# Patient Record
Sex: Male | Born: 1960 | Race: White | Hispanic: No | Smoking: Never smoker
Health system: Southern US, Community
[De-identification: ages and names within clinical notes are randomized; demographics above are authoritative.]

## PROBLEM LIST (undated history)

## (undated) DIAGNOSIS — N419 Inflammatory disease of prostate, unspecified: Secondary | ICD-10-CM

## (undated) DIAGNOSIS — R9431 Abnormal electrocardiogram [ECG] [EKG]: Secondary | ICD-10-CM

## (undated) DIAGNOSIS — L719 Rosacea, unspecified: Secondary | ICD-10-CM

## (undated) DIAGNOSIS — R9439 Abnormal result of other cardiovascular function study: Secondary | ICD-10-CM

## (undated) DIAGNOSIS — R569 Unspecified convulsions: Secondary | ICD-10-CM

## (undated) HISTORY — DX: Unspecified convulsions: R56.9

## (undated) HISTORY — PX: NASAL SEPTUM SURGERY: SHX37

## (undated) HISTORY — DX: Inflammatory disease of prostate, unspecified: N41.9

## (undated) HISTORY — DX: Rosacea, unspecified: L71.9

## (undated) HISTORY — PX: COLONOSCOPY: SHX174

## (undated) HISTORY — DX: Abnormal electrocardiogram (ECG) (EKG): R94.31

## (undated) HISTORY — PX: WISDOM TOOTH EXTRACTION: SHX21

## (undated) HISTORY — DX: Abnormal result of other cardiovascular function study: R94.39

---

## 2011-05-13 DIAGNOSIS — N4281 Prostatodynia syndrome: Secondary | ICD-10-CM | POA: Insufficient documentation

## 2013-08-22 ENCOUNTER — Ambulatory Visit (INDEPENDENT_AMBULATORY_CARE_PROVIDER_SITE_OTHER): Payer: Managed Care, Other (non HMO) | Admitting: General Surgery

## 2013-08-22 ENCOUNTER — Encounter (INDEPENDENT_AMBULATORY_CARE_PROVIDER_SITE_OTHER): Payer: Self-pay | Admitting: General Surgery

## 2013-08-22 VITALS — BP 122/86 | HR 78 | Temp 97.8°F | Resp 14 | Ht 75.0 in | Wt 210.8 lb

## 2013-08-22 DIAGNOSIS — R1031 Right lower quadrant pain: Secondary | ICD-10-CM

## 2013-08-22 NOTE — Progress Notes (Signed)
Patient ID: Darrell Contreras, male   DOB: September 30, 1960, 53 y.o.   MRN: [id number]  Chief Complaint  Patient presents with  . New Evaluation    eval RIH    HPI [name] is a 53 y.o. male.  Right inguinal pain  HPI Has had years of right inguinal pain with exertion and lifting.  No bulge or swelling.    No incarceration. Also has post-coital pain and pain with ejaculation. No past medical history on file.  No past surgical history on file.  Family History  Problem Relation Age of Onset  . Cancer Mother 74    breast cancer  . Cancer Father 31    skin cancer    Social History History  Substance Use Topics  . Smoking status: Never Smoker   . Smokeless tobacco: Never Used  . Alcohol Use: 1.2 oz/week    1 Glasses of wine, 1 Cans of beer per week     Comment: nightly 1 drink    No Known Allergies  No current outpatient prescriptions on file.   No current facility-administered medications for this visit.    Review of Systems Review of Systems  Gastrointestinal:       Right inguinal pain.  All other systems reviewed and are negative.   Blood pressure 122/86, pulse 78, temperature 97.8 F (36.6 C), temperature source Temporal, resp. rate 14, height 6\' 3"  (1.905 m), weight 210 lb 12.8 oz (95.618 kg).  Physical Exam Physical Exam  Constitutional: He is oriented to person, place, and time. He appears well-developed and well-nourished.  HENT:  Head: Normocephalic and atraumatic.  Right Ear: External ear normal.  Eyes: Conjunctivae and EOM are normal. Pupils are equal, round, and reactive to light.  Neck: Normal range of motion. Neck supple.  Cardiovascular: Normal rate, regular rhythm, normal heart sounds and intact distal pulses.   Pulmonary/Chest: Effort normal and breath sounds normal.  Abdominal: There is tenderness (Right lower quadrant tenderness).  Genitourinary: Penis normal.  Musculoskeletal: Normal range of motion.  Neurological: He is alert and oriented to person,  place, and time.  Skin: Skin is warm and dry.  Psychiatric: He has a normal mood and affect. His behavior is normal. Judgment and thought content normal.    Data Reviewed No data to review  Assessment    Right inguinal pain, no hernia     Plan    Refer to primary care as needed.  Als refer to urology        Gwenyth Ober 08/22/2013, 9:13 AM

## 2014-07-24 ENCOUNTER — Encounter: Payer: Self-pay | Admitting: *Deleted

## 2014-07-24 ENCOUNTER — Emergency Department (INDEPENDENT_AMBULATORY_CARE_PROVIDER_SITE_OTHER)
Admission: EM | Admit: 2014-07-24 | Discharge: 2014-07-24 | Disposition: A | Discharge status: Home / Self Care | Payer: Managed Care, Other (non HMO) | Source: Home / Self Care | Attending: Emergency Medicine | Admitting: Emergency Medicine

## 2014-07-24 DIAGNOSIS — S0101XD Laceration without foreign body of scalp, subsequent encounter: Secondary | ICD-10-CM

## 2014-07-24 DIAGNOSIS — A6000 Herpesviral infection of urogenital system, unspecified: Secondary | ICD-10-CM | POA: Diagnosis not present

## 2014-07-24 MED ORDER — VALACYCLOVIR HCL 1 G PO TABS: 1000.0000 mg | ORAL_TABLET | Freq: Every day | ORAL | Status: AC
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
## 2015-01-16 ENCOUNTER — Ambulatory Visit (INDEPENDENT_AMBULATORY_CARE_PROVIDER_SITE_OTHER): Payer: Managed Care, Other (non HMO) | Admitting: Family Medicine

## 2015-01-16 ENCOUNTER — Encounter: Payer: Self-pay | Admitting: Family Medicine

## 2015-01-16 VITALS — BP 130/79 | HR 63 | Ht 76.0 in | Wt 209.0 lb

## 2015-01-16 DIAGNOSIS — N529 Male erectile dysfunction, unspecified: Secondary | ICD-10-CM

## 2015-01-16 DIAGNOSIS — Z87438 Personal history of other diseases of male genital organs: Secondary | ICD-10-CM | POA: Diagnosis not present

## 2015-01-16 DIAGNOSIS — N4281 Prostatodynia syndrome: Secondary | ICD-10-CM

## 2015-01-16 DIAGNOSIS — B002 Herpesviral gingivostomatitis and pharyngotonsillitis: Secondary | ICD-10-CM | POA: Insufficient documentation

## 2015-01-16 DIAGNOSIS — N400 Enlarged prostate without lower urinary tract symptoms: Secondary | ICD-10-CM | POA: Insufficient documentation

## 2015-01-16 NOTE — Assessment & Plan Note (Signed)
Check testosterone, LH, FSH

## 2015-01-16 NOTE — Progress Notes (Signed)
Darrell Contreras is a 54 y.o. male who presents to [place]: [facility]  today for establish care.  He has a history of frequent episodes of prostatitis with possible BPH. In the past he was prescribed finasteride. He notes he has not been taking this for last 7 months and attempt to conceive a child. He is worried about his risk of prostate cancer. His father was diagnosed with prostate cancer but died of a different diagnosis. He denies any significant urinary symptoms. He notes mild urinary frequency and mild difficulty to postpone urination and mild weakness urinary stream. He does have some erectile dysfunction symptoms difficulty maintaining erections. He typically takes Cialis which works well.  Additionally patient notes a decreased sperm count. He's been attempting to conceive a child for the last several months and has had difficulty. He is successfully conceived 4 children prior. He notes that he's been using a laptop on his lab and thinks this may be the cause.   History reviewed. No pertinent past medical history. History reviewed. No pertinent past surgical history. Social History  Substance Use Topics  . Smoking status: Never Smoker   . Smokeless tobacco: Never Used  . Alcohol Use: No   family history includes Aneurysm in his father; Cancer (age of onset: 12) in his mother; Cancer (age of onset: 23) in his father.  ROS as above Medications: Current Outpatient Prescriptions  Medication Sig Dispense Refill  . Azelaic Acid 15 % cream Apply topically 2 (two) times daily. After skin is thoroughly washed and patted dry, gently but thoroughly massage a thin film of azelaic acid cream into the affected area twice daily, in the morning and evening.    . finasteride (PROSCAR) 5 MG tablet Take 5 mg by mouth.     No current facility-administered medications for this visit.   No Known Allergies   Exam:  BP 130/79 mmHg  Pulse 63  Ht 6\' 4"  (1.93 m)  Wt 209 lb  (94.802 kg)  BMI 25.45 kg/m2 Gen: Well NAD  HEENT: EOMI,  MMM Lungs: Normal work of breathing. CTABL Heart: RRR no MRG Abd: NABS, Soft. Nondistended, Nontender Exts: Brisk capillary refill, warm and well perfused.  Genitals: No inguinal lymphadenopathy. Testicles are descended bilaterally and nontender with no masses. Penis circumcised and normal appearing without lesions.  No results found for this or any previous visit (from the past 24 hour(s)). No results found.   Please see individual assessment and plan sections.

## 2015-01-16 NOTE — Patient Instructions (Signed)
Thank you for coming in today. Get morning testing soon.  Return for a wellness test in [place].  Try to get records sent to me.

## 2015-01-16 NOTE — Assessment & Plan Note (Signed)
Check PSA. Discussed risks and benefits of prostate cancer screening.

## 2015-01-18 LAB — TESTOSTERONE, FREE, TOTAL, SHBG
SEX HORMONE BINDING: 50 nmol/L (ref 10–50)
TESTOSTERONE-% FREE: 1.5 % — AB (ref 1.6–2.9)
Testosterone, Free: 54.3 pg/mL (ref 47.0–244.0)
Testosterone: 356 ng/dL (ref 300–890)

## 2015-01-18 LAB — FSH/LH
FSH: 8.2 m[IU]/mL (ref 1.4–18.1)
LH: 6.7 m[IU]/mL (ref 1.5–9.3)

## 2015-01-18 LAB — PSA: PSA: 0.25 ng/mL (ref <=4.00)

## 2015-01-18 NOTE — Progress Notes (Signed)
Quick Note:  Labs are normal. Repeat PSA in 1 year ______

## 2015-12-05 ENCOUNTER — Encounter: Payer: Self-pay | Admitting: Family Medicine

## 2015-12-26 ENCOUNTER — Other Ambulatory Visit: Payer: Self-pay | Admitting: Family Medicine

## 2015-12-26 ENCOUNTER — Ambulatory Visit (INDEPENDENT_AMBULATORY_CARE_PROVIDER_SITE_OTHER): Payer: Managed Care, Other (non HMO) | Admitting: Family Medicine

## 2015-12-26 ENCOUNTER — Encounter: Payer: Self-pay | Admitting: Family Medicine

## 2015-12-26 VITALS — BP 136/83 | HR 60 | Ht 76.0 in | Wt 217.0 lb

## 2015-12-26 DIAGNOSIS — IMO0001 Reserved for inherently not codable concepts without codable children: Secondary | ICD-10-CM

## 2015-12-26 DIAGNOSIS — Z0000 Encounter for general adult medical examination without abnormal findings: Secondary | ICD-10-CM | POA: Diagnosis not present

## 2015-12-26 DIAGNOSIS — Z87438 Personal history of other diseases of male genital organs: Secondary | ICD-10-CM

## 2015-12-26 LAB — CBC
HCT: 45.6 % (ref 38.5–50.0)
Hemoglobin: 15.5 g/dL (ref 13.2–17.1)
MCH: 30 pg (ref 27.0–33.0)
MCHC: 34 g/dL (ref 32.0–36.0)
MCV: 88.4 fL (ref 80.0–100.0)
MPV: 10.5 fL (ref 7.5–12.5)
PLATELETS: 253 10*3/uL (ref 140–400)
RBC: 5.16 MIL/uL (ref 4.20–5.80)
RDW: 13.7 % (ref 11.0–15.0)
WBC: 5.6 10*3/uL (ref 3.8–10.8)

## 2015-12-26 LAB — TSH: TSH: 1.5 mIU/L (ref 0.40–4.50)

## 2015-12-26 LAB — COMPREHENSIVE METABOLIC PANEL
ALT: 21 U/L (ref 9–46)
AST: 23 U/L (ref 10–35)
Albumin: 4.5 g/dL (ref 3.6–5.1)
Alkaline Phosphatase: 54 U/L (ref 40–115)
BUN: 11 mg/dL (ref 7–25)
CHLORIDE: 105 mmol/L (ref 98–110)
CO2: 25 mmol/L (ref 20–31)
Calcium: 10 mg/dL (ref 8.6–10.3)
Creat: 1.03 mg/dL (ref 0.70–1.33)
GLUCOSE: 100 mg/dL — AB (ref 65–99)
Potassium: 4.6 mmol/L (ref 3.5–5.3)
Sodium: 137 mmol/L (ref 135–146)
Total Bilirubin: 0.8 mg/dL (ref 0.2–1.2)
Total Protein: 7.1 g/dL (ref 6.1–8.1)

## 2015-12-26 LAB — LIPID PANEL
Cholesterol: 180 mg/dL (ref 125–200)
HDL: 46 mg/dL (ref >=40)
LDL Cholesterol: 111 mg/dL (ref <130)
TRIGLYCERIDES: 116 mg/dL (ref <150)
Total CHOL/HDL Ratio: 3.9 Ratio (ref <=5.0)
VLDL: 23 mg/dL (ref <30)

## 2015-12-26 LAB — HEMOGLOBIN A1C
Hgb A1c MFr Bld: 5.7 % — ABNORMAL HIGH (ref <5.7)
Mean Plasma Glucose: 117 mg/dL

## 2015-12-26 LAB — PSA: PSA: 0.2 ng/mL (ref <=4.0)

## 2015-12-26 NOTE — Progress Notes (Signed)
       Darrell Contreras is a 55 y.o. male who presents to [place] today for well visit. Patient is here for today with essentially no symptoms. He feels very well and only takes the below medication for rosacea. He exercises regularly. No fevers or chills nausea vomiting or diarrhea. He feels safe at home and satisfied with his job.   Past Medical History:  Diagnosis Date  . Prostatitis    History reviewed. No pertinent surgical history. Social History  Substance Use Topics  . Smoking status: Never Smoker  . Smokeless tobacco: Never Used  . Alcohol use No   family history includes Aneurysm in his father; Cancer (age of onset: 38) in his mother; Cancer (age of onset: 53) in his father.  ROS as above:  Medications: Current Outpatient Prescriptions  Medication Sig Dispense Refill  . Azelaic Acid 15 % cream Apply topically 2 (two) times daily. After skin is thoroughly washed and patted dry, gently but thoroughly massage a thin film of azelaic acid cream into the affected area twice daily, in the morning and evening.     No current facility-administered medications for this visit.    No Known Allergies   Exam:  BP 136/83   Pulse 60   Ht 6\' 4"  (1.93 m)   Wt 217 lb (98.4 kg)   BMI 26.41 kg/m  Gen: Well NAD HEENT: EOMI,  MMM Lungs: Normal work of breathing. CTABL Heart: RRR no MRG Abd: NABS, Soft. Nondistended, Nontender Exts: Brisk capillary refill, warm and well perfused.   No results found for this or any previous visit (from the past 24 hour(s)). No results found.    Assessment and Plan: 55 y.o. male with Well adult. Obtain basic fasting labs. Patient declined influenza vaccine today. He will think about his colon cancer screening options. Recheck in 1 year or sooner if needed.   Orders Placed This Encounter  Procedures  . PSA  . CBC  . Comprehensive  metabolic panel    Order Specific Question:   Has the patient fasted?    Answer:   No  . Hemoglobin A1c  . Lipid panel    Order Specific Question:   Has the patient fasted?    Answer:   No  . TSH    Discussed warning signs or symptoms. Please see discharge instructions. Patient expresses understanding.

## 2015-12-26 NOTE — Patient Instructions (Signed)
Thank you for coming in today. Get fasting labs.  Return in 1 year or sooner if needed.  Let me know what kind of colon cancer screening you want to do.  Let me know when you get your flu vaccine.

## 2016-01-30 ENCOUNTER — Encounter: Payer: Self-pay | Admitting: Internal Medicine

## 2016-03-19 ENCOUNTER — Ambulatory Visit (AMBULATORY_SURGERY_CENTER): Payer: Self-pay | Admitting: *Deleted

## 2016-03-19 VITALS — Ht 76.0 in | Wt 214.0 lb

## 2016-03-19 DIAGNOSIS — Z1211 Encounter for screening for malignant neoplasm of colon: Secondary | ICD-10-CM

## 2016-03-19 MED ORDER — NA SULFATE-K SULFATE-MG SULF 17.5-3.13-1.6 GM/177ML PO SOLN: 1.0000 | Freq: Once | ORAL | 0 refills | Status: AC

## 2016-03-19 NOTE — Progress Notes (Signed)
No egg or soy allergy. Never had sedation besides during wisdom teeth extraction.  No home O2.  No diet meds.

## 2016-03-20 ENCOUNTER — Encounter: Payer: Self-pay | Admitting: Internal Medicine

## 2016-04-02 ENCOUNTER — Ambulatory Visit (AMBULATORY_SURGERY_CENTER): Payer: Managed Care, Other (non HMO) | Admitting: Internal Medicine

## 2016-04-02 ENCOUNTER — Encounter: Payer: Self-pay | Admitting: Internal Medicine

## 2016-04-02 VITALS — BP 120/82 | HR 66 | Temp 98.6°F | Resp 15 | Ht 76.0 in | Wt 214.0 lb

## 2016-04-02 DIAGNOSIS — R569 Unspecified convulsions: Secondary | ICD-10-CM

## 2016-04-02 DIAGNOSIS — D122 Benign neoplasm of ascending colon: Secondary | ICD-10-CM

## 2016-04-02 DIAGNOSIS — Z1211 Encounter for screening for malignant neoplasm of colon: Secondary | ICD-10-CM | POA: Diagnosis present

## 2016-04-02 DIAGNOSIS — Z1212 Encounter for screening for malignant neoplasm of rectum: Secondary | ICD-10-CM | POA: Diagnosis not present

## 2016-04-02 MED ORDER — SODIUM CHLORIDE 0.9 % IV SOLN: 500.0000 mL | INTRAVENOUS | Status: DC

## 2016-04-02 NOTE — Patient Instructions (Signed)
Discharge instructions given. Handouts on polyps and  Diverticulosis. Resume previous medications. No ibuprofen,naproxen,or other non-steroidal anti-inflammatory drugs for 2 weeks. YOU HAD AN ENDOSCOPIC PROCEDURE TODAY AT [facility]:   Refer to the procedure report that was given to you for any specific questions about what was found during the examination.  If the procedure report does not answer your questions, please call your gastroenterologist to clarify.  If you requested that your care partner not be given the details of your procedure findings, then the procedure report has been included in a sealed envelope for you to review at your convenience later.  YOU SHOULD EXPECT: Some feelings of bloating in the abdomen. Passage of more gas than usual.  Walking can help get rid of the air that was put into your GI tract during the procedure and reduce the bloating. If you had a lower endoscopy (such as a colonoscopy or flexible sigmoidoscopy) you may notice spotting of blood in your stool or on the toilet paper. If you underwent a bowel prep for your procedure, you may not have a normal bowel movement for a few days.  Please Note:  You might notice some irritation and congestion in your nose or some drainage.  This is from the oxygen used during your procedure.  There is no need for concern and it should clear up in a day or so.  SYMPTOMS TO REPORT IMMEDIATELY:   Following lower endoscopy (colonoscopy or flexible sigmoidoscopy):  Excessive amounts of blood in the stool  Significant tenderness or worsening of abdominal pains  Swelling of the abdomen that is new, acute  Fever of 100F or higher  For urgent or emergent issues, a gastroenterologist can be reached at any hour by calling [phone].   DIET:  We do recommend a small meal at first, but then you may proceed to your regular diet.  Drink plenty of fluids but you should avoid alcoholic beverages for 24  hours.  ACTIVITY:  You should plan to take it easy for the rest of today and you should NOT DRIVE or use heavy machinery until tomorrow (because of the sedation medicines used during the test).    FOLLOW UP: Our staff will call the number listed on your records the next business day following your procedure to check on you and address any questions or concerns that you may have regarding the information given to you following your procedure. If we do not reach you, we will leave a message.  However, if you are feeling well and you are not experiencing any problems, there is no need to return our call.  We will assume that you have returned to your regular daily activities without incident.  If any biopsies were taken you will be contacted by phone or by letter within the next 1-3 weeks.  Please call us at [phone] if you have not heard about the biopsies in 3 weeks.    SIGNATURES/CONFIDENTIALITY: You and/or your care partner have signed paperwork which will be entered into your electronic medical record.  These signatures attest to the fact that that the information above on your After Visit Summary has been reviewed and is understood.  Full responsibility of the confidentiality of this discharge information lies with you and/or your care-partner.

## 2016-04-02 NOTE — Progress Notes (Signed)
A and O x3. Report to RN. Tolerated MAC anesthesia well. 

## 2016-04-02 NOTE — Progress Notes (Signed)
Called to room to assist during endoscopic procedure.  Patient ID and intended procedure confirmed with present staff. Received instructions for my participation in the procedure from the performing physician.  

## 2016-04-02 NOTE — Op Note (Signed)
[place] Patient Name: Darrell Contreras Procedure Date: 04/02/2016 12:14 PM MRN: [id number] Endoscopist: Jerene Bears , MD Age: 55 Referring MD:  Date of Birth: [birth year] Gender: Male Account #: [id number] Procedure:                Colonoscopy Indications:              Screening for colorectal malignant neoplasm, This                            is the patient's first colonoscopy Medicines:                Monitored Anesthesia Care Procedure:                Pre-Anesthesia Assessment:                           - Prior to the procedure, a History and Physical                            was performed, and patient medications and                            allergies were reviewed. The patient's tolerance of                            previous anesthesia was also reviewed. The risks                            and benefits of the procedure and the sedation                            options and risks were discussed with the patient.                            All questions were answered, and informed consent                            was obtained. Prior Anticoagulants: The patient has                            taken no previous anticoagulant or antiplatelet                            agents. ASA Grade Assessment: II - A patient with                            mild systemic disease. After reviewing the risks                            and benefits, the patient was deemed in                            satisfactory condition to undergo the procedure.  After obtaining informed consent, the colonoscope                            was passed under direct vision. Throughout the                            procedure, the patient's blood pressure, pulse, and                            oxygen saturations were monitored continuously. The                            Model CF-HQ190L [id number]) scope was introduced                            through the anus and advanced  to the the cecum,                            identified by appendiceal orifice and ileocecal                            valve. The colonoscopy was performed without                            difficulty. The patient tolerated the procedure                            well. The quality of the bowel preparation was                            good. The ileocecal valve, appendiceal orifice, and                            rectum were photographed. Scope In: 12:23:49 PM Scope Out: 12:44:44 PM Total Procedure Duration: 0 hours 20 minutes 55 seconds  Findings:                 The perianal and digital rectal examinations were                            normal.                           A 10 mm polyp was found in the ascending colon. The                            polyp was semi-pedunculated. The polyp was removed                            with a hot snare. Resection and retrieval were                            complete.  Two sessile polyps were found in the ascending                            colon. The polyps were 3 to 4 mm in size. These                            polyps were removed with a cold snare. Resection                            and retrieval were complete.                           A few small-mouthed diverticula were found in the                            descending colon and ascending colon.                           The retroflexed view of the distal rectum and anal                            verge was normal and showed no anal or rectal                            abnormalities. Complications:            No immediate complications. Estimated Blood Loss:     Estimated blood loss was minimal. Impression:               - One 10 mm polyp in the ascending colon, removed                            with a hot snare. Resected and retrieved.                           - Two 3 to 4 mm polyps in the ascending colon,                            removed with a cold  snare. Resected and retrieved.                           - Mild diverticulosis in the descending colon and                            in the ascending colon.                           - The distal rectum and anal verge are normal on                            retroflexion view. Recommendation:           - Patient has a contact number available for  emergencies. The signs and symptoms of potential                            delayed complications were discussed with the                            patient. Return to normal activities tomorrow.                            Written discharge instructions were provided to the                            patient.                           - Resume previous diet.                           - Continue present medications.                           - No ibuprofen, naproxen, or other non-steroidal                            anti-inflammatory drugs for 2 weeks after polyp                            removal.                           - Await pathology results.                           - Repeat colonoscopy is recommended for                            surveillance. The colonoscopy date will be                            determined after pathology results from today's                            exam become available for review. Jerene Bears, MD 04/02/2016 12:48:33 PM This report has been signed electronically.

## 2016-04-02 NOTE — Progress Notes (Signed)
Patient states tha he has had seizures in the past while IV was placed.  Patient states that the last one was 15 years ago.  Patient placed in trendelenburg position while IV was placed, and he was distracted by another RN.  Patient did very well.  Left flat in bed post IV.

## 2016-04-03 ENCOUNTER — Telehealth: Payer: Self-pay | Admitting: *Deleted

## 2016-04-03 ENCOUNTER — Telehealth: Payer: Self-pay

## 2016-04-03 NOTE — Telephone Encounter (Signed)
No answer. Number identifier. Message left to call if any questions or concerns. 

## 2016-04-03 NOTE — Telephone Encounter (Signed)
Attempted to reach pt. With follow up call following endoscopic procedure yesterday.   LM on pt.'s ans. Machine.

## 2016-04-11 ENCOUNTER — Encounter: Payer: Self-pay | Admitting: Family Medicine

## 2016-04-14 NOTE — Telephone Encounter (Signed)
Glass blower/designer spoke with billing dept at [facility], there is no outstanding bill for this Pt. If he was sent a bill, he will need to bring that to [facility] for review.  Left VM for Pt to return clinic call, callback information provided.

## 2016-04-15 ENCOUNTER — Telehealth: Payer: Self-pay | Admitting: Internal Medicine

## 2016-04-15 ENCOUNTER — Encounter: Payer: Self-pay | Admitting: Internal Medicine

## 2016-04-15 NOTE — Telephone Encounter (Signed)
Results letter reviewed with pt . 

## 2016-04-28 ENCOUNTER — Ambulatory Visit (INDEPENDENT_AMBULATORY_CARE_PROVIDER_SITE_OTHER): Payer: Managed Care, Other (non HMO) | Admitting: Family Medicine

## 2016-04-28 ENCOUNTER — Encounter: Payer: Self-pay | Admitting: Family Medicine

## 2016-04-28 DIAGNOSIS — M67442 Ganglion, left hand: Secondary | ICD-10-CM | POA: Diagnosis not present

## 2016-04-28 MED ORDER — DICLOFENAC SODIUM 1 % TD GEL: 2.0000 g | Freq: Four times a day (QID) | TRANSDERMAL | 11 refills | Status: DC

## 2016-04-28 NOTE — Progress Notes (Signed)
   Darrell Contreras is a 56 y.o. male who presents to [facility] today for left 5th digit nodule.  Patient is noted a nodule at the radial side of his fifth digit on the left hand. This is proximal near the MCP. He notes it is not particularly painful or bothersome but he does notice it. He denies any injury or numbness or loss of function or weakness.   Past Medical History:  Diagnosis Date  . Prostatitis   . Rosacea   . Seizures ([place])    states had seizure when IV was put in 15 years ago   Past Surgical History:  Procedure Laterality Date  . WISDOM TOOTH EXTRACTION     Social History  Substance Use Topics  . Smoking status: Never Smoker  . Smokeless tobacco: Never Used  . Alcohol use 4.2 oz/week    7 Glasses of wine per week     ROS:  As above   Medications: Current Outpatient Prescriptions  Medication Sig Dispense Refill  . Azelaic Acid 15 % cream Apply topically 2 (two) times daily. After skin is thoroughly washed and patted dry, gently but thoroughly massage a thin film of azelaic acid cream into the affected area twice daily, in the morning and evening.    . diclofenac sodium (VOLTAREN) 1 % GEL Apply 2 g topically 4 (four) times daily. To affected joint. 100 g 11   Current Facility-Administered Medications  Medication Dose Route Frequency Provider Last Rate Last Dose  . 0.9 %  sodium chloride infusion  500 mL Intravenous Continuous Jerene Bears, MD       No Known Allergies   Exam:  BP 139/65   Pulse 66   Wt 211 lb (95.7 kg)   BMI 25.68 kg/m  General: Well Developed, well nourished, and in no acute distress.  Neuro/Psych: Alert and oriented x3, extra-ocular muscles intact, able to move all 4 extremities, sensation grossly intact. Skin: Warm and dry, no rashes noted.  Respiratory: Not using accessory muscles, speaking in full sentences, trachea midline.  Cardiovascular: Pulses palpable, no extremity edema. Abdomen: Does  not appear distended. MSK: Left hand normal-appearing. Small palpable nodule mobile at the palmar aspect of the skin overlying the right fifth proximal phalanx near the radial side. It does not appear to move with finger flexion. Finger flexion strength extension strength and motion are normal. Sensation and capillary refill are intact distally  Limited musculoskeletal ultrasound:  Left fifth digit: Small simple cystic structure just radial to the flexor tendon 1.5x1.5 mm in size not associated with blood flow consistent in appearance with ganglion cyst. Normal flexor tendon and bony structures present.     No results found for this or any previous visit (from the past 48 hour(s)). No results found.    Assessment and Plan: 56 y.o. male with tiny ganglion cyst. Plan for watchful waiting and topical voltaren gel.  Recheck PRN.     No orders of the defined types were placed in this encounter.   Discussed warning signs or symptoms. Please see discharge instructions. Patient expresses understanding.

## 2016-04-28 NOTE — Patient Instructions (Signed)
Thank you for coming in today. I think this is a ganglion cyst.  Let keep an eye on it for the next few months.  We should recheck PSA in September.    Ganglion Cyst Introduction A ganglion cyst is a noncancerous, fluid-filled lump that occurs near joints or tendons. The ganglion cyst grows out of a joint or the lining of a tendon. It most often develops in the hand or wrist, but it can also develop in the shoulder, elbow, hip, knee, ankle, or foot. The round or oval ganglion cyst can be the size of a pea or larger than a grape. Increased activity may enlarge the size of the cyst because more fluid starts to build up. What are the causes? It is not known what causes a ganglion cyst to grow. However, it may be related to:  Inflammation or irritation around the joint.  An injury.  Repetitive movements or overuse.  Arthritis. What increases the risk? Risk factors include:  Being a woman.  Being age 12-50. What are the signs or symptoms? Symptoms may include:  A lump. This most often appears on the hand or wrist, but it can occur in other areas of the body.  Tingling.  Pain.  Numbness.  Muscle weakness.  Weak grip.  Less movement in a joint. How is this diagnosed? Ganglion cysts are most often diagnosed based on a physical exam. Your health care provider will feel the lump and may shine a light alongside it. If it is a ganglion cyst, a light often shines through it. Your health care provider may order an X-ray, ultrasound, or MRI to rule out other conditions. How is this treated? Ganglion cysts usually go away on their own without treatment. If pain or other symptoms are involved, treatment may be needed. Treatment is also needed if the ganglion cyst limits your movement or if it gets infected. Treatment may include:  Wearing a brace or splint on your wrist or finger.  Taking anti-inflammatory medicine.  Draining fluid from the lump with a needle  (aspiration).  Injecting a steroid into the joint.  Surgery to remove the ganglion cyst. Follow these instructions at home:  Do not press on the ganglion cyst, poke it with a needle, or hit it.  Take medicines only as directed by your health care provider.  Wear your brace or splint as directed by your health care provider.  Watch your ganglion cyst for any changes.  Keep all follow-up visits as directed by your health care provider. This is important. Contact a health care provider if:  Your ganglion cyst becomes larger or more painful.  You have increased redness, red streaks, or swelling.  You have pus coming from the lump.  You have weakness or numbness in the affected area.  You have a fever or chills. This information is not intended to replace advice given to you by your health care provider. Make sure you discuss any questions you have with your health care provider. Document Released: 03/27/2000 Document Revised: 09/05/2015 Document Reviewed: 09/12/2013  2017 Elsevier

## 2016-04-29 DIAGNOSIS — M67442 Ganglion, left hand: Secondary | ICD-10-CM | POA: Insufficient documentation

## 2016-08-24 ENCOUNTER — Ambulatory Visit (INDEPENDENT_AMBULATORY_CARE_PROVIDER_SITE_OTHER): Payer: Managed Care, Other (non HMO) | Admitting: Cardiology

## 2016-08-24 ENCOUNTER — Encounter: Payer: Self-pay | Admitting: Cardiology

## 2016-08-24 VITALS — BP 116/74 | HR 64 | Ht 76.0 in | Wt 214.0 lb

## 2016-08-24 DIAGNOSIS — R072 Precordial pain: Secondary | ICD-10-CM

## 2016-08-24 NOTE — Progress Notes (Signed)
Darrell Mccallum, MD Reason for referral-Chest pain  HPI: 56 year old male for evaluation of chest pain at request of Lynne Leader, MD. Patient has had occasions of chest pain in the past with cardiology evaluations last being approximately 15 years ago by his report. Over the past 9 months he describes occasional chest pain that is diffuse in nature lasting several hours at a time. It is not pleuritic, positional, exertional or related to food. No radiation or associated symptoms. Resolves spontaneously. He does not have exertional chest pain, dyspnea on exertion, orthopnea, PND, pedal edema or syncope. Because of the above we were asked to evaluate.   Current Outpatient Prescriptions  Medication Sig Dispense Refill  . Azelaic Acid 15 % cream Apply topically 2 (two) times daily. After skin is thoroughly washed and patted dry, gently but thoroughly massage a thin film of azelaic acid cream into the affected area twice daily, in the morning and evening.    . diclofenac sodium (VOLTAREN) 1 % GEL Apply 2 g topically 4 (four) times daily. To affected joint. 100 g 11   Current Facility-Administered Medications  Medication Dose Route Frequency Provider Last Rate Last Dose  . 0.9 %  sodium chloride infusion  500 mL Intravenous Continuous Pyrtle, Lajuan Lines, MD        No Known Allergies   Past Medical History:  Diagnosis Date  . Prostatitis   . Rosacea   . Seizures ([place])    states had seizure when IV was put in 15 years ago    Past Surgical History:  Procedure Laterality Date  . WISDOM TOOTH EXTRACTION      Social History   Social History  . Marital status: Married    Spouse name: N/A  . Number of children: 5  . 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 Smoking status: Never Smoker  . Smokeless tobacco: Never Used  . Alcohol use 4.2 oz/week    7 Glasses of wine per week  . Drug use: No  . Sexual activity: Not on file    Other Topics Concern  . Not on file   Social History Narrative  . No narrative on file    Family History  Problem Relation Age of Onset  . Cancer Mother 28       breast cancer  . Cancer Father 64       skin cancer  . Aneurysm Father   . Colon cancer Neg Hx   . Esophageal cancer Neg Hx   . Rectal cancer Neg Hx   . Stomach cancer Neg Hx     ROS: no fevers or chills, productive cough, hemoptysis, dysphasia, odynophagia, melena, hematochezia, dysuria, hematuria, rash, seizure activity, orthopnea, PND, pedal edema, claudication. Remaining systems are negative.  Physical Exam:   Blood pressure 116/74, pulse 64, height 6\' 4"  (1.93 m), weight 97.1 kg (214 lb).  General:  Well developed/well nourished in NAD Skin warm/dry Patient not depressed No peripheral clubbing Back-normal HEENT-normal/normal eyelids Neck supple/normal carotid upstroke bilaterally; no bruits; no JVD; no thyromegaly chest - CTA/ normal expansion CV - RRR/normal S1 and S2; no murmurs, rubs or gallops;  PMI nondisplaced Abdomen -NT/ND, no HSM, no mass, + bowel sounds, no bruit 2+ femoral pulses, no bruits Ext-no edema, chords, 2+ DP Neuro-grossly nonfocal  ECG - sinus rhythm at a rate of 64. Incomplete right bundle branch block. personally reviewed  A/P  1 Chest pain-symptoms are atypical. We will arrange an exercise treadmill for risk stratification.   2 Anxiety-patient wonders whether this may be contributing to his symptoms. If his treadmill is negative I have asked him to follow up with primary care.   3 rosacea-management per primary care.   Kirk Ruths, MD

## 2016-08-24 NOTE — Patient Instructions (Signed)
Medication Instructions:   NO CHANGE  Testing/Procedures:  Your physician has requested that you have an exercise tolerance test. For further information please visit [email or web address]. Please also follow instruction sheet, as given.    Follow-Up:  Your physician recommends that you schedule a follow-up appointment in: AS NEEDED PENDING TEST RESULTS    Exercise Stress Electrocardiogram An exercise stress electrocardiogram is a test to check how blood flows to your heart. It is done to find areas of poor blood flow. You will need to walk on a treadmill for this test. The electrocardiogram will record your heartbeat when you are at rest and when you are exercising. What happens before the procedure?  Do not have drinks with caffeine or foods with caffeine for 24 hours before the test, or as told by your doctor. This includes coffee, tea (even decaf tea), sodas, chocolate, and cocoa.  Follow your doctor's instructions about eating and drinking before the test.  Ask your doctor what medicines you should or should not take before the test. Take your medicines with water unless told by your doctor not to.  If you use an inhaler, bring it with you to the test.  Bring a snack to eat after the test.  Do not  smoke for 4 hours before the test.  Do not put lotions, powders, creams, or oils on your chest before the test.  Wear comfortable shoes and clothing. What happens during the procedure?  You will have patches put on your chest. Small areas of your chest may need to be shaved. Wires will be connected to the patches.  Your heart rate will be watched while you are resting and while you are exercising.  You will walk on the treadmill. The treadmill will slowly get faster to raise your heart rate.  The test will take about 1-2 hours. What happens after the procedure?  Your heart rate and blood pressure will be watched after the test.  You may return to your normal diet,  activities, and medicines or as told by your doctor. This information is not intended to replace advice given to you by your health care provider. Make sure you discuss any questions you have with your health care provider. Document Released: 09/16/2007 Document Revised: 11/27/2015 Document Reviewed: 12/05/2012 Elsevier Interactive Patient Education  2017 [employer].

## 2016-09-08 ENCOUNTER — Telehealth (HOSPITAL_COMMUNITY): Payer: Self-pay

## 2016-09-08 NOTE — Telephone Encounter (Signed)
Encounter complete. 

## 2016-09-10 ENCOUNTER — Ambulatory Visit (HOSPITAL_COMMUNITY)
Admission: RE | Admit: 2016-09-10 | Discharge: 2016-09-10 | Disposition: A | Discharge status: Home / Self Care | Payer: 59 | Source: Ambulatory Visit | Attending: Cardiovascular Disease | Admitting: Cardiovascular Disease

## 2016-09-10 ENCOUNTER — Encounter (HOSPITAL_COMMUNITY): Payer: Self-pay | Admitting: *Deleted

## 2016-09-10 DIAGNOSIS — R072 Precordial pain: Secondary | ICD-10-CM

## 2016-09-10 DIAGNOSIS — I4510 Unspecified right bundle-branch block: Secondary | ICD-10-CM | POA: Insufficient documentation

## 2016-09-10 NOTE — Progress Notes (Signed)
Dr. Sallyanne Kuster.reviewed Ett and said pt can leave.

## 2016-09-11 ENCOUNTER — Ambulatory Visit: Payer: Managed Care, Other (non HMO) | Admitting: Cardiology

## 2016-09-11 LAB — EXERCISE TOLERANCE TEST
CSEPEDS: 51 s
CSEPEW: 15.1 METS
CSEPPHR: 173 {beats}/min
Exercise duration (min): 12 min
MPHR: 164 {beats}/min
Percent HR: 105 %
RPE: 16
Rest HR: 71 {beats}/min

## 2016-09-14 ENCOUNTER — Encounter: Payer: Self-pay | Admitting: Cardiology

## 2016-09-14 ENCOUNTER — Telehealth: Payer: Self-pay | Admitting: *Deleted

## 2016-09-14 NOTE — Telephone Encounter (Signed)
Left message for pt to call.

## 2016-09-14 NOTE — Telephone Encounter (Signed)
-----   Message from Lelon Perla, MD sent at 09/12/2016  9:43 AM EDT ----- Would arrange paov; will likely need cath Kirk Ruths  ----- Message ----- From: Cristopher Estimable, RN Sent: 09/11/2016   2:25 PM To: Lelon Perla, MD

## 2016-09-15 ENCOUNTER — Telehealth: Payer: Self-pay | Admitting: Cardiology

## 2016-09-15 NOTE — Telephone Encounter (Signed)
Patient calling, requesting that his EKG and stress test results are updated on his MyChart. Thanks.

## 2016-09-15 NOTE — Telephone Encounter (Signed)
Left detailed message for patient, per the e mail I sent back to the patient this morning, ECG is not available to be seen. The results opf the ECG dictated at the office visit given to the patient. Also GXT results were released to my chart this morning. Results given to patient and the need for the appt Thursday with hao meng pa given. He is to call back with questions.

## 2016-09-15 NOTE — Telephone Encounter (Signed)
Communicated with patient through my chart. Aware of results and appointment scheduled with hoa.

## 2016-09-17 ENCOUNTER — Ambulatory Visit: Payer: Managed Care, Other (non HMO) | Admitting: Physician Assistant

## 2016-10-08 ENCOUNTER — Encounter: Payer: Self-pay | Admitting: *Deleted

## 2016-10-21 ENCOUNTER — Telehealth: Payer: Self-pay | Admitting: Cardiology

## 2016-10-21 NOTE — Telephone Encounter (Signed)
New message     Pt returned call from 6/5 regarding scheduling appt to go over exercise test results, why does he need the appointment to go over results?  Leave message on voicemail letting him know what your found.

## 2016-10-21 NOTE — Telephone Encounter (Signed)
Left msg to call.

## 2016-10-23 NOTE — Telephone Encounter (Signed)
Left message for patient, Darrell Contreras was abnormal and he needs to discuss with dr Stanford Breed if any other testing or procedures are needed. Number left for patient to call.

## 2016-10-28 ENCOUNTER — Ambulatory Visit: Payer: Managed Care, Other (non HMO) | Admitting: Cardiology

## 2016-11-08 NOTE — Progress Notes (Signed)
Cardiology Office Note    Date:  11/09/2016   ID:  Darrell Contreras, DOB 1960-10-08, MRN [id number]  PCP:  Gregor Hams, MD  Cardiologist: Dr. Stanford Breed   Chief Complaint  Patient presents with  . Follow-up    Abnormal Stress Test    History of Present Illness:    Darrell Contreras is a 56 y.o. male with past medical history of seizures and no prior cardiac history who presents to the office today for follow-up of his recent stress test.   He was examined by Dr. Stanford Breed on 08/24/2016 as a new patient referral for evaluation of chest pain. He reported occasional episodes of pain over the past several months lasting for hours at a time and not related to exertion. A treadmill stress test was performed which showed 2 mm horizontal ST depression in the inferior and lateral leads with occasional PVC's, overall findings suggestive of ischemia. However, exercise tolerance was excellent at 15 METS.   In talking with the patient today, he initially voiced multiple complaints about his stress test as he thought it was going to be read the afternoon it was performed. He did not follow-up until now as he thought Dr. Stanford Breed would personally evaluate him if it was indeed significantly abnormal.  Since his stress test, he denies any recurrent chest pain or dyspnea on exertion. He has been jogging multiples times per week without any anginal symptoms. No recent orthopnea, PND, or lower extremity edema. He reports being under increased stress due to raising a 56-year old and 63-year old along with work stress and the recent purchase of a new house. He has significantly modified his diet by limiting dairy and intake or red meat.   He denies any prior history of HTN, HLD, Type 2 DM, or prior cardiac history.   Past Medical History:  Diagnosis Date  . Abnormal stress ECG    a. 08/2016: stress test showing 2 mm horizontal ST depression in the inferior and lateral leads with occasional PVC's, overall findings  suggestive of ischemia but possible repol abnormality.  . Prostatitis   . Rosacea   . Seizures ([place])    states had seizure when IV was put in 15 years ago    Past Surgical History:  Procedure Laterality Date  . WISDOM TOOTH EXTRACTION      Current Medications: Outpatient Medications Prior to Visit  Medication Sig Dispense Refill  . Azelaic Acid 15 % cream Apply topically 2 (two) times daily. After skin is thoroughly washed and patted dry, gently but thoroughly massage a thin film of azelaic acid cream into the affected area twice daily, in the morning and evening.    . diclofenac sodium (VOLTAREN) 1 % GEL Apply 2 g topically 4 (four) times daily. To affected joint. (Patient not taking: Reported on 11/09/2016) 100 g 11   Facility-Administered Medications Prior to Visit  Medication Dose Route Frequency Provider Last Rate Last Dose  . 0.9 %  sodium chloride infusion  500 mL Intravenous Continuous Pyrtle, Lajuan Lines, MD         Allergies:   Patient has no known allergies.   Social History   Social History  . Marital status: Married    Spouse name: N/A  . Number of children: 5  . 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 Smoking status: Never Smoker  . Smokeless tobacco: Never Used  .  Alcohol use 4.2 oz/week    7 Glasses of wine per week  . Drug use: No  . Sexual activity: Not Asked   Other Topics Concern  . None   Social History Narrative  . None     Family History:  The patient's family history includes Aneurysm in his father; Cancer (age of onset: 30) in his mother; Cancer (age of onset: 38) in his father.   Review of Systems:   Please see the history of present illness.     General:  No chills, fever, night sweats or weight changes.  Cardiovascular:  No chest pain, dyspnea on exertion, edema, orthopnea, palpitations, paroxysmal nocturnal dyspnea. Dermatological: No rash, lesions/masses Respiratory: No cough,  dyspnea Urologic: No hematuria, dysuria Abdominal:   No nausea, vomiting, diarrhea, bright red blood per rectum, melena, or hematemesis Neurologic:  No visual changes, wkns, changes in mental status.  He denies any of the above symptoms.   All other systems reviewed and are otherwise negative except as noted above.   Physical Exam:    VS:  BP 126/66   Pulse 71   Ht 6\' 4"  (1.93 m)   Wt 211 lb (95.7 kg)   SpO2 97%   BMI 25.68 kg/m    General: Well developed, well nourished Caucasian male appearing in no acute distress. Head: Normocephalic, atraumatic, sclera non-icteric, no xanthomas, nares are without discharge.  Neck: No carotid bruits. JVD not elevated.  Lungs: Respirations regular and unlabored, without wheezes or rales.  Heart: Regular rate and rhythm. No S3 or S4.  No murmur, no rubs, or gallops appreciated. Abdomen: Soft, non-tender, non-distended with normoactive bowel sounds. No hepatomegaly. No rebound/guarding. No obvious abdominal masses. Msk:  Strength and tone appear normal for age. No joint deformities or effusions. Extremities: No clubbing or cyanosis. No lower extremity edema.  Distal pedal pulses are 2+ bilaterally. Neuro: Alert and oriented X 3. Moves all extremities spontaneously. No focal deficits noted. Psych:  Responds to questions appropriately with a normal affect. Skin: No rashes or lesions noted  Wt Readings from Last 3 Encounters:  11/09/16 211 lb (95.7 kg)  08/24/16 214 lb (97.1 kg)  04/28/16 211 lb (95.7 kg)     Studies/Labs Reviewed:   EKG:  EKG is not ordered today.   Recent Labs: 12/26/2015: ALT 21; BUN 11; Creat 1.03; Hemoglobin 15.5; Platelets 253; Potassium 4.6; Sodium 137; TSH 1.50   Lipid Panel    Component Value Date/Time   CHOL 180 12/26/2015 1014   TRIG 116 12/26/2015 1014   HDL 46 12/26/2015 1014   CHOLHDL 3.9 12/26/2015 1014   VLDL 23 12/26/2015 1014   LDLCALC 111 12/26/2015 1014    Additional studies/ records that were  reviewed today include:   Treadmill Stress Test: 09/10/2016  Blood pressure demonstrated a normal response to exercise.  No T wave inversion was noted during stress.  Horizontal ST segment depression ST segment depression of 2 mm was noted during stress in the II, III, aVF, V6, V5 and V4 leads, beginning at 3 minutes of stress, and returning to baseline after 1-5 minutes of recovery.  The patient reported shortness of breath during the stress test. The patient experienced no angina during the stress test  Overall, the patient's exercise capacity was excellent.  [facility] Treadmill Score: intermediate risk   2 mm horizontal ST depression in the inferior and lateral leads, occasional PVC's - findings suggestive of ischemia. Exercise tolerance, however, is excellent at 15 METS without chest pain. There  is an underlying RBBB and the EKG changes could represent repolarization abnormality. Clinical correlation is advised.  Assessment:    1. Abnormal ECG during exercise stress test   2. Precordial pain      Plan:   In order of problems listed above:  1. Abnormal Stress Test/ Precordial Chest Pain - the patient was initially evaluated by Dr. Stanford Breed in 08/2016 for chest pain with an ETT being recommended. This was performed and showed 2 mm horizontal ST depression in the inferior and lateral leads with occasional PVC's, overall findings suggestive of ischemia but possible repol abnormality. However, exercise tolerance was excellent at 15 METS.  - he denies any recurrent chest pain since his last office visit. Has been jogging 2+ miles multiple times per week without any anginal symptoms.  - we reviewed his stress test results in detail and that further ischemic evaluation is indicated in the setting of his abnormal EKG. He wishes to avoid invasive evaluation with a cardiac catheterization, therefore I recommended a Coronary CT for if his calcium score is low, we can be more reassurred his EKG  changes were not due to an ischemic etiology. He wishes to delay the procedure for now and to further discuss it with his wife prior to scheduling. I informed the patient to make our office aware of his decision.    Medication Adjustments/Labs and Tests Ordered: Current medicines are reviewed at length with the patient today.  Concerns regarding medicines are outlined above.  Medication changes, Labs and Tests ordered today are listed in the Patient Instructions below. Patient Instructions  Medication Instructions: No changes   Follow-Up: Please contact the office to arrange a Coronary CT Please follow up with Dr. Stanford Breed after the Coronary CT has been done.    If you need a refill on your cardiac medications before your next appointment, please call your pharmacy.    Signed, Erma Heritage, PA-C  11/09/2016 12:17 PM    [facility] [place], [place], [place]  [address] Phone: [phone]; Fax: [phone]  [address], [place], [place] [address] Phone: [phone]

## 2016-11-09 ENCOUNTER — Ambulatory Visit (INDEPENDENT_AMBULATORY_CARE_PROVIDER_SITE_OTHER): Payer: Managed Care, Other (non HMO) | Admitting: Student

## 2016-11-09 ENCOUNTER — Encounter: Payer: Self-pay | Admitting: Student

## 2016-11-09 VITALS — BP 126/66 | HR 71 | Ht 76.0 in | Wt 211.0 lb

## 2016-11-09 DIAGNOSIS — R072 Precordial pain: Secondary | ICD-10-CM | POA: Diagnosis not present

## 2016-11-09 DIAGNOSIS — R9431 Abnormal electrocardiogram [ECG] [EKG]: Secondary | ICD-10-CM

## 2016-11-09 HISTORY — DX: Abnormal electrocardiogram (ECG) (EKG): R94.31

## 2016-11-09 NOTE — Patient Instructions (Signed)
Medication Instructions: No changes   Follow-Up: Please contact the office to arrange a Coronary CT Please follow up with Dr. Stanford Breed after the coronary ct have been done.    If you need a refill on your cardiac medications before your next appointment, please call your pharmacy.

## 2016-11-12 ENCOUNTER — Encounter: Payer: Self-pay | Admitting: Student

## 2016-11-12 ENCOUNTER — Encounter: Payer: Self-pay | Admitting: Cardiology

## 2016-11-12 ENCOUNTER — Other Ambulatory Visit: Payer: Self-pay | Admitting: Student

## 2016-11-12 ENCOUNTER — Other Ambulatory Visit: Payer: Self-pay | Admitting: *Deleted

## 2016-11-12 DIAGNOSIS — R9439 Abnormal result of other cardiovascular function study: Secondary | ICD-10-CM

## 2016-11-17 ENCOUNTER — Encounter: Payer: Self-pay | Admitting: Cardiology

## 2016-11-18 ENCOUNTER — Encounter: Payer: Self-pay | Admitting: Family Medicine

## 2016-11-27 ENCOUNTER — Encounter: Payer: Self-pay | Admitting: Cardiology

## 2016-11-27 ENCOUNTER — Encounter: Payer: Self-pay | Admitting: Student

## 2016-11-30 ENCOUNTER — Encounter: Payer: Self-pay | Admitting: Student

## 2016-11-30 ENCOUNTER — Encounter: Payer: Self-pay | Admitting: Cardiology

## 2016-12-01 ENCOUNTER — Ambulatory Visit (HOSPITAL_COMMUNITY)
Admission: RE | Admit: 2016-12-01 | Discharge: 2016-12-01 | Disposition: A | Discharge status: Home / Self Care | Payer: 59 | Source: Ambulatory Visit | Attending: Cardiology | Admitting: Cardiology

## 2016-12-01 DIAGNOSIS — R9439 Abnormal result of other cardiovascular function study: Secondary | ICD-10-CM | POA: Diagnosis not present

## 2016-12-01 MED ORDER — NITROGLYCERIN 0.4 MG SL SUBL
SUBLINGUAL_TABLET | SUBLINGUAL | Status: AC
Start: 2016-12-01 — End: 2016-12-01
  Administered 2016-12-01: 0.4 mg via SUBLINGUAL
  Filled 2016-12-01: qty 1

## 2016-12-01 MED ORDER — IOPAMIDOL (ISOVUE-370) INJECTION 76%
INTRAVENOUS | Status: AC
  Administered 2016-12-01: 80 mL via INTRAVENOUS
  Filled 2016-12-01: qty 100

## 2016-12-01 MED ORDER — NITROGLYCERIN 0.4 MG SL SUBL
0.4000 mg | SUBLINGUAL_TABLET | Freq: Once | SUBLINGUAL | Status: AC
  Administered 2016-12-01: 0.4 mg via SUBLINGUAL

## 2016-12-23 ENCOUNTER — Ambulatory Visit (INDEPENDENT_AMBULATORY_CARE_PROVIDER_SITE_OTHER): Payer: 59 | Admitting: Family Medicine

## 2016-12-23 ENCOUNTER — Encounter: Payer: Self-pay | Admitting: Family Medicine

## 2016-12-23 VITALS — BP 121/76 | HR 65 | Ht 76.0 in | Wt 215.0 lb

## 2016-12-23 DIAGNOSIS — Z0000 Encounter for general adult medical examination without abnormal findings: Secondary | ICD-10-CM | POA: Diagnosis not present

## 2016-12-23 DIAGNOSIS — Z125 Encounter for screening for malignant neoplasm of prostate: Secondary | ICD-10-CM | POA: Diagnosis not present

## 2016-12-23 DIAGNOSIS — I517 Cardiomegaly: Secondary | ICD-10-CM | POA: Diagnosis not present

## 2016-12-23 DIAGNOSIS — Z23 Encounter for immunization: Secondary | ICD-10-CM | POA: Diagnosis not present

## 2016-12-23 DIAGNOSIS — R7303 Prediabetes: Secondary | ICD-10-CM | POA: Insufficient documentation

## 2016-12-23 MED ORDER — VALACYCLOVIR HCL 1 G PO TABS: 2000.0000 mg | ORAL_TABLET | Freq: Two times a day (BID) | ORAL | 2 refills | Status: DC

## 2016-12-23 NOTE — Progress Notes (Signed)
Darrell Contreras is a 56 y.o. male who presents to [place] today for adult visit.  Darrell Contreras is doing well. Since his last visit he has had a workup with cardiology for chest pain. He had a mildly abnormal exercise tolerance test with slight ST changes. He had excellent exercise capacity however. To further risk stratify he had a CT calcium score which was 0. Since then he has not had any further chest pain. He reports being told that he had a mildly enlarged heart and is worried about Chagas disease.  He notes it is not exercising as much as he would like but overall doing well with no chest pain palpitations or shortness of breath.  Patient notes a history of cold sores and used to have a Valtrex prescription that he could use with onset of the symptoms. Like a refill of the prescription if able.   Past Medical History:  Diagnosis Date  . Abnormal ECG during exercise stress test 11/09/2016  . Abnormal stress ECG    a. 08/2016: stress test showing 2 mm horizontal ST depression in the inferior and lateral leads with occasional PVC's, overall findings suggestive of ischemia but possible repol abnormality.  . Prostatitis   . Rosacea   . Seizures ([place])    states had seizure when IV was put in 15 years ago   Past Surgical History:  Procedure Laterality Date  . WISDOM TOOTH EXTRACTION     Social History  Substance Use Topics  . Smoking status: Never Smoker  . Smokeless tobacco: Never Used  . Alcohol use 4.2 oz/week    7 Glasses of wine per week   family history includes Aneurysm in his father; Cancer (age of onset: 74) in his mother; Cancer (age of onset: 72) in his father.  ROS as above:  Medications: Current Outpatient Prescriptions  Medication Sig Dispense Refill  . Azelaic Acid 15 % cream Apply topically 2 (two) times daily. After skin is thoroughly washed and  patted dry, gently but thoroughly massage a thin film of azelaic acid cream into the affected area twice daily, in the morning and evening.    . diclofenac sodium (VOLTAREN) 1 % GEL Apply 2 g topically 4 (four) times daily. To affected joint. 100 g 11  . valACYclovir (VALTREX) 1000 MG tablet Take 2 tablets (2,000 mg total) by mouth 2 (two) times daily. For coldsore at onset of symptoms 4 tablet 2   No current facility-administered medications for this visit.    No Known Allergies  Health Maintenance Health Maintenance  Topic Date Due  . INFLUENZA VACCINE  12/23/2017 (Originally 11/11/2016)  . Hepatitis C Screening  12/12/2028 (Originally 09-09-1960)  . COLONOSCOPY  04/03/2019  . TETANUS/TDAP  04/13/2024  . HIV Screening  Completed     Exam:  BP 121/76   Pulse 65   Ht 6\' 4"  (1.93 m)   Wt 215 lb (97.5 kg)   BMI 26.17 kg/m   Wt Readings from Last 10 Encounters:  12/23/16 215 lb (97.5 kg)  11/09/16 211 lb (95.7 kg)  08/24/16 214 lb (97.1 kg)  04/28/16 211 lb (95.7 kg)  04/02/16 214 lb (97.1 kg)  03/19/16 214 lb (97.1 kg)  12/26/15 217 lb (98.4 kg)  01/16/15 209 lb (94.8 kg)  07/24/14 206 lb (93.4 kg)  08/22/13 210 lb 12.8 oz (95.6 kg)    Gen: Well NAD HEENT: EOMI,  MMM Lungs: Normal work of breathing.  CTABL Heart: RRR no MRG Abd: NABS, Soft. Nondistended, Nontender Exts: Brisk capillary refill, warm and well perfused.  Skin: No concerning lesions   No results found for this or any previous visit (from the past 72 hour(s)). No results found.    Assessment and Plan: 56 y.o. male with  Well adult. Doing well. Darrell Contreras to check basic fasting labs administer flu vaccine today. Additionally we'll check PSA to follow-up prostate cancer screening and check A1c to follow-up mild prediabetes. We'll check Trypanosoma antibody for Chagas disease.  Recheck yearly or sooner if needed.    Orders Placed This Encounter  Procedures  . Flu Vaccine QUAD 36+ mos IM  . CBC  . COMPLETE  METABOLIC PANEL WITH GFR  . Lipid Panel w/reflex Direct LDL  . PSA  . Hemoglobin A1c  . Trypanosoma cruzi A[place], Total   Meds ordered this encounter  Medications  . valACYclovir (VALTREX) 1000 MG tablet    Sig: Take 2 tablets (2,000 mg total) by mouth 2 (two) times daily. For coldsore at onset of symptoms    Dispense:  4 tablet    Refill:  2     Discussed warning signs or symptoms. Please see discharge instructions. Patient expresses understanding.

## 2016-12-23 NOTE — Patient Instructions (Addendum)
Thank you for coming in today. Ask your insurance company about Shingrix vaccine (Shingles).   We will do labs today.   Recheck yearly or sooner if needed.   If you start having cold sore symptoms take the valtrex.  Keep a dose ready to go at home.   Recheck sooner if needed.    Try to get moderate exercise for 30 minutes most of days of the week.

## 2016-12-24 LAB — COMPLETE METABOLIC PANEL WITH GFR
AG RATIO: 1.8 (calc) (ref 1.0–2.5)
ALBUMIN MSPROF: 4.3 g/dL (ref 3.6–5.1)
ALT: 33 U/L (ref 9–46)
AST: 28 U/L (ref 10–35)
Alkaline phosphatase (APISO): 65 U/L (ref 40–115)
BUN: 15 mg/dL (ref 7–25)
CALCIUM: 9.6 mg/dL (ref 8.6–10.3)
CO2: 25 mmol/L (ref 20–32)
Chloride: 108 mmol/L (ref 98–110)
Creat: 1.01 mg/dL (ref 0.70–1.33)
GFR, EST AFRICAN AMERICAN: 96 mL/min/{1.73_m2} (ref >=60)
GFR, EST NON AFRICAN AMERICAN: 83 mL/min/{1.73_m2} (ref >=60)
Globulin: 2.4 g/dL (calc) (ref 1.9–3.7)
Glucose, Bld: 69 mg/dL (ref 65–99)
Potassium: 4.1 mmol/L (ref 3.5–5.3)
Sodium: 140 mmol/L (ref 135–146)
TOTAL PROTEIN: 6.7 g/dL (ref 6.1–8.1)
Total Bilirubin: 0.8 mg/dL (ref 0.2–1.2)

## 2016-12-24 LAB — PSA: PSA: 0.3 ng/mL (ref <=4.0)

## 2016-12-24 LAB — LIPID PANEL W/REFLEX DIRECT LDL
Cholesterol: 189 mg/dL (ref <200)
HDL: 46 mg/dL (ref >40)
LDL CHOLESTEROL (CALC): 115 mg/dL — AB
NON-HDL CHOLESTEROL (CALC): 143 mg/dL — AB (ref <130)
TRIGLYCERIDES: 157 mg/dL — AB (ref <150)
Total CHOL/HDL Ratio: 4.1 (calc) (ref <5.0)

## 2016-12-24 LAB — CBC
HCT: 42.1 % (ref 38.5–50.0)
HEMOGLOBIN: 14 g/dL (ref 13.2–17.1)
MCH: 29.2 pg (ref 27.0–33.0)
MCHC: 33.3 g/dL (ref 32.0–36.0)
MCV: 87.7 fL (ref 80.0–100.0)
MPV: 10.7 fL (ref 7.5–12.5)
Platelets: 206 10*3/uL (ref 140–400)
RBC: 4.8 10*6/uL (ref 4.20–5.80)
RDW: 13.2 % (ref 11.0–15.0)
WBC: 4.7 10*3/uL (ref 3.8–10.8)

## 2016-12-24 LAB — HEMOGLOBIN A1C
EAG (MMOL/L): 6.3 (calc)
Hgb A1c MFr Bld: 5.6 % of total Hgb (ref <5.7)
MEAN PLASMA GLUCOSE: 114 (calc)

## 2016-12-27 LAB — TRYPANOSOMA CRUZI ANTIBODY, TOTAL: TRYPANOSOMA CRUZI ANTIBODY TOTAL: NONREACTIVE

## 2017-01-12 ENCOUNTER — Encounter: Payer: Self-pay | Admitting: Student

## 2017-01-12 ENCOUNTER — Encounter: Payer: Self-pay | Admitting: Cardiology

## 2017-01-14 NOTE — Telephone Encounter (Signed)
Called patient, leaving vm msg regarding his overpayment/refund.  It is on his hospital account and provided the phone number for him to request refund.

## 2017-03-25 ENCOUNTER — Telehealth: Payer: Self-pay

## 2017-03-25 NOTE — Telephone Encounter (Signed)
Kobie would like Dr Georgina Snell to take over his [facility] prescription. He would like a printed prescription to send to [place]. Please advise.   Rx pended.

## 2017-03-26 MED ORDER — AZELAIC ACID 15 % EX GEL: CUTANEOUS | 12 refills | Status: DC

## 2017-03-26 NOTE — Telephone Encounter (Signed)
Medication printed and ready for pick up.  

## 2017-03-26 NOTE — Telephone Encounter (Signed)
Called patient he stated that he will come pick up script on his lunch break. Tracy Kinner,CMA

## 2017-03-26 NOTE — Telephone Encounter (Signed)
12/14 @ 154 pm PT p/up script

## 2017-04-09 ENCOUNTER — Telehealth: Payer: Self-pay

## 2017-04-09 MED ORDER — AZELAIC ACID 15 % EX GEL: CUTANEOUS | 12 refills | Status: DC

## 2017-04-09 NOTE — Telephone Encounter (Signed)
Patient called he stated that he needs a new Rx for the Finacea gel instead of the cream. 50 grams with 12 refills. Please advise Rx needs to be printed so that he can send it to [place] because it would be cheaper. Bronx Brogden,CMA

## 2017-04-09 NOTE — Telephone Encounter (Signed)
Medication RX printed

## 2017-04-12 NOTE — Telephone Encounter (Signed)
Patient came in the office and picked up Rx. Caylor Cerino,CMA

## 2017-09-30 ENCOUNTER — Encounter: Payer: Self-pay | Admitting: Family Medicine

## 2017-09-30 ENCOUNTER — Ambulatory Visit (INDEPENDENT_AMBULATORY_CARE_PROVIDER_SITE_OTHER): Payer: 59 | Admitting: Family Medicine

## 2017-09-30 VITALS — BP 125/82 | HR 67 | Ht 76.0 in | Wt 211.0 lb

## 2017-09-30 DIAGNOSIS — Z0000 Encounter for general adult medical examination without abnormal findings: Secondary | ICD-10-CM

## 2017-09-30 DIAGNOSIS — Z87438 Personal history of other diseases of male genital organs: Secondary | ICD-10-CM | POA: Diagnosis not present

## 2017-09-30 DIAGNOSIS — N4281 Prostatodynia syndrome: Secondary | ICD-10-CM | POA: Diagnosis not present

## 2017-09-30 MED ORDER — VALACYCLOVIR HCL 1 G PO TABS: 2000.0000 mg | ORAL_TABLET | Freq: Two times a day (BID) | ORAL | 2 refills | Status: DC

## 2017-09-30 NOTE — Progress Notes (Signed)
Darrell Contreras is a 57 y.o. male who presents to [place]: [place] today for well adult exam.  Mr. Darrell Contreras is in a good state of health overall.  His main issue is persistent urogenital symptoms including pelvic pressure and painful ejaculations and blood in the ejaculate.  This is been ongoing for years and previous to my care he had been taking finasteride for over 10 years.  He had some care off and on with urology over the years and has a follow-up appointment with urology in about 2 months.  He has had persistent PSA checks and they have always been normal.  He notes his symptoms have not really changed all that much recently.  He would like his symptoms to be better in the arm but would like to avoid extensive invasive treatment if possible.  He notes that his wife and toddler in the past of had C. difficile and he is concerned about taking antibiotics in a household where there possibly is C. difficile spores still remaining.  He notes that because he has young children he is been able to exercise less than he would like but does manage to get opportunistic exercise including walking during the day.  He uses his lunchtime as a way to catch up on sleep and typically takes a nap in his car.  He notes this is important for his self-care and allows him to be more present in the evening with his children.  He is careful about his diet and trying to keep his weight in a normal range.  He tries to eat healthy meals and would like to avoid excessive medication if possible.   ROS as above:  Exam:  BP 125/82   Pulse 67   Ht 6\' 4"  (1.93 m)   Wt 211 lb (95.7 kg)   BMI 25.68 kg/m  Gen: Well NAD HEENT: EOMI,  MMM Lungs: Normal work of breathing. CTABL Heart: RRR no MRG Abd: NABS, Soft. Nondistended, Nontender Exts: Brisk capillary refill, warm and well perfused.  Prostate exam  deferred  Depression screen [facility] 2/9 09/30/2017 12/23/2016  Decreased Interest 1 0  Down, Depressed, Hopeless 1 0  PHQ - 2 Score 2 0  Altered sleeping 0 -  Tired, decreased energy 0 -  Change in appetite 0 -  Feeling bad or failure about yourself  0 -  Trouble concentrating 0 -  Moving slowly or fidgety/restless 0 -  Suicidal thoughts 0 -  PHQ-9 Score 2 -  Difficult doing work/chores Not difficult at all -     Lab and Radiology Results Labs from last year reviewed including serial PSAs  Assessment and Plan: 57 y.o. male with  Well adult.  Doing reasonably well.  Patient continues with good self-care including good nutrition and catching up on sleep when he can.  Adding exercise would be a bit helpful.  Plan to continue lifestyle modification.  Additionally will check basic fasting labs to follow-up his underlying fundamental health issues including CBC metabolic panel and lipid panel.  His urinary and prostate symptoms remain his most important medical problem.  Plan to check PSA total and free today.  Agree with urology consultation.  Restarting finasteride may make sense.  I would be hesitant about using antibiotics unless we are pretty sure he has prostatitis due to his potential exposure to C. Difficile.   If all is well recheck with me yearly.  Return sooner if needed.  Valacyclovir refilled due to history of oral herpes.   Orders Placed This Encounter  Procedures  . CBC  . COMPLETE METABOLIC PANEL WITH GFR  . Lipid Panel w/reflex Direct LDL  . PSA, total and free   Meds ordered this encounter  Medications  . valACYclovir (VALTREX) 1000 MG tablet    Sig: Take 2 tablets (2,000 mg total) by mouth 2 (two) times daily. For coldsore at onset of symptoms    Dispense:  4 tablet    Refill:  2     Historical information moved to improve visibility of documentation.  Past Medical History:  Diagnosis Date  . Abnormal ECG during exercise stress test 11/09/2016  . Abnormal  stress ECG    a. 08/2016: stress test showing 2 mm horizontal ST depression in the inferior and lateral leads with occasional PVC's, overall findings suggestive of ischemia but possible repol abnormality.  . Prostatitis   . Rosacea   . Seizures ([place])    states had seizure when IV was put in 15 years ago   Past Surgical History:  Procedure Laterality Date  . WISDOM TOOTH EXTRACTION     Social History   Tobacco Use  . Smoking status: Never Smoker  . Smokeless tobacco: Never Used  Substance Use Topics  . Alcohol use: Yes    Alcohol/week: 4.2 oz    Types: 7 Glasses of wine per week   family history includes Aneurysm in his father; Cancer (age of onset: 76) in his mother; Cancer (age of onset: 87) in his father.  Medications: Current Outpatient Medications  Medication Sig Dispense Refill  . Azelaic Acid (FINACEA) 15 % cream After skin is thoroughly washed and patted dry, gently but thoroughly massage a thin film of azelaic acid gel into the affected area twice daily, in the morning and evening. 50 g 12  . diclofenac sodium (VOLTAREN) 1 % GEL Apply 2 g topically 4 (four) times daily. To affected joint. 100 g 11  . valACYclovir (VALTREX) 1000 MG tablet Take 2 tablets (2,000 mg total) by mouth 2 (two) times daily. For coldsore at onset of symptoms 4 tablet 2   No current facility-administered medications for this visit.    No Known Allergies  Health Maintenance Health Maintenance  Topic Date Due  . Hepatitis C Screening  12/12/2028 (Originally Aug 16, 1960)  . INFLUENZA VACCINE  11/11/2017  . COLONOSCOPY  04/03/2019  . TETANUS/TDAP  07/14/2024  . HIV Screening  Completed    Discussed warning signs or symptoms. Please see discharge instructions. Patient expresses understanding.

## 2017-09-30 NOTE — Patient Instructions (Signed)
Thank you for coming in today. Recheck yearly as needed.

## 2017-10-01 LAB — PSA, TOTAL AND FREE
PSA, % Free: 50 % (calc) (ref >25)
PSA, Free: 0.1 ng/mL
PSA, TOTAL: 0.2 ng/mL (ref <=4.0)

## 2017-10-01 LAB — COMPLETE METABOLIC PANEL WITH GFR
AG Ratio: 1.7 (calc) (ref 1.0–2.5)
ALT: 24 U/L (ref 9–46)
AST: 26 U/L (ref 10–35)
Albumin: 4.7 g/dL (ref 3.6–5.1)
Alkaline phosphatase (APISO): 61 U/L (ref 40–115)
BILIRUBIN TOTAL: 1.3 mg/dL — AB (ref 0.2–1.2)
BUN: 13 mg/dL (ref 7–25)
CHLORIDE: 105 mmol/L (ref 98–110)
CO2: 27 mmol/L (ref 20–32)
Calcium: 10.3 mg/dL (ref 8.6–10.3)
Creat: 1.09 mg/dL (ref 0.70–1.33)
GFR, EST AFRICAN AMERICAN: 87 mL/min/{1.73_m2} (ref >=60)
GFR, Est Non African American: 75 mL/min/{1.73_m2} (ref >=60)
GLUCOSE: 106 mg/dL — AB (ref 65–99)
Globulin: 2.7 g/dL (calc) (ref 1.9–3.7)
POTASSIUM: 4.9 mmol/L (ref 3.5–5.3)
Sodium: 139 mmol/L (ref 135–146)
TOTAL PROTEIN: 7.4 g/dL (ref 6.1–8.1)

## 2017-10-01 LAB — CBC
HCT: 45.4 % (ref 38.5–50.0)
Hemoglobin: 15.6 g/dL (ref 13.2–17.1)
MCH: 29.8 pg (ref 27.0–33.0)
MCHC: 34.4 g/dL (ref 32.0–36.0)
MCV: 86.6 fL (ref 80.0–100.0)
MPV: 10.6 fL (ref 7.5–12.5)
PLATELETS: 229 10*3/uL (ref 140–400)
RBC: 5.24 10*6/uL (ref 4.20–5.80)
RDW: 13 % (ref 11.0–15.0)
WBC: 5 10*3/uL (ref 3.8–10.8)

## 2017-10-01 LAB — LIPID PANEL W/REFLEX DIRECT LDL
CHOLESTEROL: 203 mg/dL — AB (ref <200)
HDL: 44 mg/dL (ref >40)
LDL CHOLESTEROL (CALC): 137 mg/dL — AB
Non-HDL Cholesterol (Calc): 159 mg/dL (calc) — ABNORMAL HIGH (ref <130)
Total CHOL/HDL Ratio: 4.6 (calc) (ref <5.0)
Triglycerides: 111 mg/dL (ref <150)

## 2017-10-19 ENCOUNTER — Encounter

## 2017-10-19 ENCOUNTER — Ambulatory Visit (INDEPENDENT_AMBULATORY_CARE_PROVIDER_SITE_OTHER): Payer: 59 | Admitting: Gastroenterology

## 2017-10-19 ENCOUNTER — Encounter: Payer: Self-pay | Admitting: Gastroenterology

## 2017-10-19 VITALS — BP 116/74 | HR 68 | Ht 76.0 in | Wt 216.0 lb

## 2017-10-19 DIAGNOSIS — R1033 Periumbilical pain: Secondary | ICD-10-CM

## 2017-10-19 NOTE — Patient Instructions (Signed)
Continue observation for now. Recommend CT scan if you desire evaluation.

## 2017-10-19 NOTE — Progress Notes (Addendum)
10/19/2017 Darrell Contreras [id number] 12/22/1960   HISTORY OF PRESENT ILLNESS:  This is a 57 year old male who is known to Dr. Hilarie Fredrickson for colonoscopy in 03/2016 at which time he was found to have three polps removed, one of which was 10 mm in size, tubular adenomas so repeat colonoscopy was recommended in 3 years from that time.  Anyway, he presents to our office today with complaints of mild, non-specific abdominal pain that has been present for several years intermittently.  He describes it as a low-grade subtle pain that is made worse by bending over.  He actually tells me that it is much improved recently since discontinuing ETOH use.  Was drinking 2-3 drinks nightly for years.  Has not been drinking ETOH for about 4 weeks.  Denies absolutely any other GI complaints.  Pain was not affected by eating or BM's.  CMP and CBC normal except for total bili of 1.3.   Past Medical History:  Diagnosis Date  . Abnormal ECG during exercise stress test 11/09/2016  . Abnormal stress ECG    a. 08/2016: stress test showing 2 mm horizontal ST depression in the inferior and lateral leads with occasional PVC's, overall findings suggestive of ischemia but possible repol abnormality.  . Prostatitis   . Rosacea   . Seizures ([place])    states had seizure when IV was put in 15 years ago   Past Surgical History:  Procedure Laterality Date  . WISDOM TOOTH EXTRACTION      reports that he has never smoked. He has never used smokeless tobacco. He reports that he drinks alcohol. He reports that he does not use drugs. family history includes Aneurysm in his father; Cancer (age of onset: 57) in his mother; Cancer (age of onset: 64) in his father. No Known Allergies    Outpatient Encounter Medications as of 10/19/2017  Medication Sig  . Azelaic Acid (FINACEA) 15 % cream After skin is thoroughly washed and patted dry, gently but thoroughly massage a thin film of azelaic acid gel into the affected area twice daily, in the  morning and evening.  . valACYclovir (VALTREX) 1000 MG tablet Take 2 tablets (2,000 mg total) by mouth 2 (two) times daily. For coldsore at onset of symptoms (Patient taking differently: Take 2,000 mg by mouth 2 (two) times daily as needed. For coldsore at onset of symptoms)  . [DISCONTINUED] diclofenac sodium (VOLTAREN) 1 % GEL Apply 2 g topically 4 (four) times daily. To affected joint.   No facility-administered encounter medications on file as of 10/19/2017.      REVIEW OF SYSTEMS  : All other systems reviewed and negative except where noted in the History of Present Illness.   PHYSICAL EXAM: BP 116/74   Pulse 68   Ht 6\' 4"  (1.93 m)   Wt 216 lb (98 kg)   BMI 26.29 kg/m  General: Well developed white male in no acute distress Head: Normocephalic and atraumatic Eyes:  Sclerae anicteric, conjunctiva pink. Ears: Normal auditory acuity Lungs: Clear throughout to auscultation; no increased WOB. Heart: Regular rate and rhythm; no M/R/G. Abdomen: Soft, non-distended.  BS present.  Non-tender. Musculoskeletal: Symmetrical with no gross deformities  Skin: No lesions on visible extremities Extremities: No edema  Neurological: Alert oriented x 4, grossly non-focal Psychological:  Alert and cooperative. Normal mood and affect  ASSESSMENT AND PLAN: *57 year old male with mild non-specific abdominal pain for years:  Mostly mid-abdomen.  About completely resolved at this point.  Thinks it was related to drinking and has about resolved since stopping ETOH use 4 weeks ago.  No other GI complaints.  Advised that CT scan of abdomen and pelvis would be modality to evaluate this due to location.  Patient declining for now, which I think is ok due to years of symptoms and now resolving.  No other alarming features.  Will observe for now.     CC:  Gregor Hams, MD  Addendum: Reviewed and agree with management. Pyrtle, Lajuan Lines, MD

## 2017-10-27 ENCOUNTER — Ambulatory Visit (INDEPENDENT_AMBULATORY_CARE_PROVIDER_SITE_OTHER): Payer: 59 | Admitting: Family Medicine

## 2017-10-27 ENCOUNTER — Encounter: Payer: Self-pay | Admitting: Family Medicine

## 2017-10-27 ENCOUNTER — Ambulatory Visit (INDEPENDENT_AMBULATORY_CARE_PROVIDER_SITE_OTHER): Payer: 59

## 2017-10-27 VITALS — BP 114/72 | HR 54 | Temp 97.9°F | Wt 214.0 lb

## 2017-10-27 DIAGNOSIS — R042 Hemoptysis: Secondary | ICD-10-CM

## 2017-10-27 DIAGNOSIS — R05 Cough: Secondary | ICD-10-CM | POA: Diagnosis not present

## 2017-10-27 NOTE — Progress Notes (Signed)
Idris Edmundson is a 57 y.o. male who presents to [place]: [employer] today for sore throat congestion and bloodstained sputum.  Quantae notes a 3 to 69-month history of mild congestion and mild sore throat. He notes occasional blood-tinged sputum.  He denies any swallowing difficulty.  He denies any reflux.  He is tried cetirizine without much benefit.  He denies chest pain palpitations shortness of breath.  No fevers or chills.  He notes that he has been exposed to HPV via oral sex in the past.  Around the same time he was exposed he did complete a 3 shot series of Gardasil 4.   ROS as above:  Exam:  BP 114/72   Pulse (!) 54   Temp 97.9 F (36.6 C) (Oral)   Wt 214 lb (97.1 kg)   BMI 26.05 kg/m  Gen: Well NAD HEENT: EOMI,  MMM Lungs: Normal work of breathing. CTABL Heart: RRR no MRG Abd: NABS, Soft. Nondistended, Nontender Exts: Brisk capillary refill, warm and well perfused.   Lab and Radiology Results Chest x-ray images personally independent reviewed. No acute infiltrates.  No significant changes. Awaiting formal radiology review.   Assessment and Plan: 57 y.o. male with  Sore throat cough congestion and blood-tinged sputum.  Ongoing now for several months.  Patient feeling typical conservative management.  Patient is concerned about HPV exposure.  Is reasonable for ENT referral at this point.  Suspect would benefit from laryngoscope versus endoscopy.  Recommend trial of Flonase while awaiting appointment for referral.   Orders Placed This Encounter  Procedures  . DG Chest 2 View    Order Specific Question:   Reason for exam:    Answer:   Cough, assess intra-thoracic pathology    Order Specific Question:   Preferred imaging location?    Answer:   [facility]  . Ambulatory referral to ENT    Referral Priority:   Routine    Referral Type:   Consultation    Referral Reason:   Specialty Services Required    Referred to Provider:   Ernestine Conrad, MD    Requested Specialty:   Otolaryngology    Number of Visits Requested:   1   No orders of the defined types were placed in this encounter.    Historical information moved to improve visibility of documentation.  Past Medical History:  Diagnosis Date  . Abnormal ECG during exercise stress test 11/09/2016  . Abnormal stress ECG    a. 08/2016: stress test showing 2 mm horizontal ST depression in the inferior and lateral leads with occasional PVC's, overall findings suggestive of ischemia but possible repol abnormality.  . Prostatitis   . Rosacea   . Seizures ([place])    states had seizure when IV was put in 15 years ago   Past Surgical History:  Procedure Laterality Date  . WISDOM TOOTH EXTRACTION     Social History   Tobacco Use  . Smoking status: Never Smoker  . Smokeless tobacco: Never Used  Substance Use Topics  . Alcohol use: Yes    Alcohol/week: 0.0 oz    Comment: d/c alcohol around 09-19-17 per pt   family history includes Aneurysm in his father; Cancer (age of onset: 25) in his mother; Cancer (age of onset: 38) in his father.  Medications: Current Outpatient Medications  Medication Sig Dispense Refill  . Azelaic Acid (FINACEA) 15 % cream After skin is thoroughly washed and patted dry,  gently but thoroughly massage a thin film of azelaic acid gel into the affected area twice daily, in the morning and evening. 50 g 12  . valACYclovir (VALTREX) 1000 MG tablet Take 2 tablets (2,000 mg total) by mouth 2 (two) times daily. For coldsore at onset of symptoms (Patient taking differently: Take 2,000 mg by mouth 2 (two) times daily as needed. For coldsore at onset of symptoms) 4 tablet 2   No current facility-administered medications for this visit.    No Known Allergies   Discussed warning signs or symptoms. Please see discharge instructions. Patient expresses understanding.

## 2017-10-27 NOTE — Patient Instructions (Signed)
Thank you for coming in today. Start flonase nasal spray.  Follow up with ENT.  Get xray now.  Recheck with me as needed.  Let me know how it goes.

## 2017-10-28 ENCOUNTER — Encounter: Payer: Self-pay | Admitting: Gastroenterology

## 2017-10-28 DIAGNOSIS — R1033 Periumbilical pain: Secondary | ICD-10-CM | POA: Insufficient documentation

## 2018-01-05 ENCOUNTER — Telehealth: Payer: Self-pay

## 2018-01-05 NOTE — Telephone Encounter (Signed)
Patient called stated that he has had trauma to his neck, he is requesting Flexril or Orphenadrine sent to [facility]. Please advise. Rhonda Cunningham,CMA

## 2018-01-06 MED ORDER — CYCLOBENZAPRINE HCL 10 MG PO TABS
10.0000 mg | ORAL_TABLET | Freq: Three times a day (TID) | ORAL | 2 refills | Status: DC | PRN
Start: 2018-01-06 — End: 2019-02-10

## 2018-01-06 NOTE — Telephone Encounter (Signed)
Patient has been advised. Rhonda Cunningham,CMA  

## 2018-01-06 NOTE — Telephone Encounter (Signed)
Sent Flexeril to pharmacy.

## 2018-03-14 ENCOUNTER — Telehealth: Payer: Self-pay | Admitting: Internal Medicine

## 2018-03-14 NOTE — Telephone Encounter (Signed)
Ok to order CT abdomen and pelvis with contrast for complaints of mid-abdominal pain.

## 2018-03-14 NOTE — Telephone Encounter (Signed)
Please see previous message

## 2018-03-14 NOTE — Telephone Encounter (Signed)
The pt states he is now ready to set up CT scan.  See 10/19/17 office note.  Mid abdominal pain.  Please advise

## 2018-03-15 ENCOUNTER — Ambulatory Visit: Payer: 59 | Admitting: Family Medicine

## 2018-03-15 ENCOUNTER — Other Ambulatory Visit: Payer: Self-pay

## 2018-03-15 DIAGNOSIS — R1033 Periumbilical pain: Secondary | ICD-10-CM

## 2018-03-15 NOTE — Telephone Encounter (Signed)
CT abd/pelvis 03/30/18 at 3:00 pm at [facility]. Reviewed briefly the instructions. Patient will come to our office for written instructions and contrast.

## 2018-03-17 ENCOUNTER — Ambulatory Visit (INDEPENDENT_AMBULATORY_CARE_PROVIDER_SITE_OTHER): Payer: 59 | Admitting: Family Medicine

## 2018-03-17 ENCOUNTER — Encounter: Payer: Self-pay | Admitting: Family Medicine

## 2018-03-17 VITALS — BP 131/84 | HR 68 | Ht 76.0 in | Wt 214.0 lb

## 2018-03-17 DIAGNOSIS — R109 Unspecified abdominal pain: Secondary | ICD-10-CM | POA: Diagnosis not present

## 2018-03-17 LAB — POCT URINALYSIS DIPSTICK
Bilirubin, UA: NEGATIVE
GLUCOSE UA: NEGATIVE
Ketones, UA: NEGATIVE
LEUKOCYTES UA: NEGATIVE
Nitrite, UA: NEGATIVE
PH UA: 7 (ref 5.0–8.0)
PROTEIN UA: NEGATIVE
RBC UA: NEGATIVE
Spec Grav, UA: 1.02 (ref 1.010–1.025)
UROBILINOGEN UA: 0.2 U/dL

## 2018-03-17 MED ORDER — CIPROFLOXACIN HCL 500 MG PO TABS: 500.0000 mg | ORAL_TABLET | Freq: Two times a day (BID) | ORAL | 0 refills | Status: DC

## 2018-03-17 NOTE — Progress Notes (Signed)
Darrell Contreras is a 57 y.o. male who presents to [facility] [place]: [place] today for pelvic pressure.  Esley notes a several month history of mild right lower quadrant abdominal pain no pelvic pressure.  He notes that he occasionally has to urinate frequently and has nocturia.  He denies any blood in the semen or with urination.  No urinary urgency or pain.  No change in bowel movements.  No vomiting diarrhea chest pain palpitations shortness of breath.  No change in diet.  No treatment tried yet.  Patient has been seen by a gastroenterologist for this previously and is in the process of being scheduled for a CT scan to figure it out.  He does have a pertinent history for history of prostatitis.   ROS as above:  Exam:  BP 131/84   Pulse 68   Ht 6\' 4"  (1.93 m)   Wt 214 lb (97.1 kg)   BMI 26.05 kg/m  Wt Readings from Last 5 Encounters:  03/17/18 214 lb (97.1 kg)  10/27/17 214 lb (97.1 kg)  10/19/17 216 lb (98 kg)  09/30/17 211 lb (95.7 kg)  12/23/16 215 lb (97.5 kg)    Gen: Well NAD HEENT: EOMI,  MMM Lungs: Normal work of breathing. CTABL Heart: RRR no MRG Abd: NABS, Soft. Nondistended, not particularly tender to palpation.  No rebound or guarding. Exts: Brisk capillary refill, warm and well perfused.  Genital exam: No herniation or inguinal lymphadenopathy.  Testicles descended bilaterally nontender. Prostate exam: Normal-appearing anus with normal rectal tone.  Prostate slightly enlarged not particularly tender no nodules palpated.  Lab and Radiology Results Results for orders placed or performed in visit on 03/17/18 (from the past 48 hour(s))  POCT Urinalysis Dipstick     Status: None   Collection Time: 03/17/18  3:33 PM  Result Value Ref Range   Color, UA yellow    Clarity, UA clear    Glucose, UA Negative Negative   Bilirubin, UA negative    Ketones, UA negative    Spec Grav, UA 1.020 1.010 - 1.025   Blood, UA negative    pH, UA 7.0 5.0 - 8.0   Protein, UA Negative Negative   Urobilinogen, UA 0.2 0.2 or 1.0 E.U./dL   Nitrite, UA negative    Leukocytes, UA Negative Negative   Appearance     Odor      Lab Results  Component Value Date   PSA 0.3 12/23/2016   PSA 0.2 12/26/2015   PSA 0.25 01/16/2015     Chemistry      Component Value Date/Time   NA 139 09/30/2017 0957   K 4.9 09/30/2017 0957   CL 105 09/30/2017 0957   CO2 27 09/30/2017 0957   BUN 13 09/30/2017 0957   CREATININE 1.09 09/30/2017 0957      Component Value Date/Time   CALCIUM 10.3 09/30/2017 0957   ALKPHOS 54 12/26/2015 1014   AST 26 09/30/2017 0957   ALT 24 09/30/2017 0957   BILITOT 1.3 (H) 09/30/2017 0957        Assessment and Plan: 57 y.o. male with mild pelvic pain: Unclear etiology.  Possible prostate etiology for pain.  Plan for trial of treatment with Cipro for empiric trial for prostatitis.  If not better is reasonable to proceed with CT scan.  Recheck as needed.  I spent 25 minutes with this patient, greater than 50% was face-to-face time counseling regarding ddx and plan and CT  scan risk vs benefit..    Orders Placed This Encounter  Procedures  . POCT Urinalysis Dipstick   No orders of the defined types were placed in this encounter.    Historical information moved to improve visibility of documentation.  Past Medical History:  Diagnosis Date  . Abnormal ECG during exercise stress test 11/09/2016  . Abnormal stress ECG    a. 08/2016: stress test showing 2 mm horizontal ST depression in the inferior and lateral leads with occasional PVC's, overall findings suggestive of ischemia but possible repol abnormality.  . Prostatitis   . Rosacea   . Seizures ([place])    states had seizure when IV was put in 15 years ago   Past Surgical History:  Procedure Laterality Date  . WISDOM TOOTH EXTRACTION     Social History   Tobacco Use  . Smoking status:  Never Smoker  . Smokeless tobacco: Never Used  Substance Use Topics  . Alcohol use: Yes    Alcohol/week: 0.0 standard drinks    Comment: d/c alcohol around 09-19-17 per pt   family history includes Aneurysm in his father; Cancer (age of onset: 28) in his mother; Cancer (age of onset: 34) in his father.  Medications: Current Outpatient Medications  Medication Sig Dispense Refill  . Azelaic Acid (FINACEA) 15 % cream After skin is thoroughly washed and patted dry, gently but thoroughly massage a thin film of azelaic acid gel into the affected area twice daily, in the morning and evening. 50 g 12  . cyclobenzaprine (FLEXERIL) 10 MG tablet Take 1 tablet (10 mg total) by mouth 3 (three) times daily as needed for muscle spasms. 30 tablet 2  . valACYclovir (VALTREX) 1000 MG tablet Take 2 tablets (2,000 mg total) by mouth 2 (two) times daily. For coldsore at onset of symptoms (Patient taking differently: Take 2,000 mg by mouth 2 (two) times daily as needed. For coldsore at onset of symptoms) 4 tablet 2   No current facility-administered medications for this visit.    No Known Allergies   Discussed warning signs or symptoms. Please see discharge instructions. Patient expresses understanding.

## 2018-03-17 NOTE — Patient Instructions (Addendum)
Thank you for coming in today.  I think the discomfort may be due to prostate.  Reasonable to try antibiotic course prior to undergoing CT.  If not better CT is reasonable.    Prostatitis Prostatitis is swelling or inflammation of the prostate gland. The prostate is a walnut-sized gland that is involved in the production of semen. It is located below a man's bladder, in front of the rectum. There are four types of prostatitis:  Chronic nonbacterial prostatitis. This is the most common type of prostatitis. It may be associated with a viral infection or autoimmune disorder.  Acute bacterial prostatitis. This is the least common type of prostatitis. It starts quickly and is usually associated with a bladder infection, high fever, and shaking chills. It can occur at any age.  Chronic bacterial prostatitis. This type usually results from acute bacterial prostatitis that happens repeatedly (is recurrent) or has not been treated properly. It can occur in men of any age but is most common among middle-aged men whose prostate has begun to get larger. The symptoms are not as severe as symptoms caused by acute bacterial prostatitis.  Prostatodynia or chronic pelvic pain syndrome (CPPS). This type is also called pelvic floor disorder. It is associated with increased muscular tone in the pelvis surrounding the prostate.  What are the causes? Bacterial prostatitis is caused by infection from bacteria. Chronic nonbacterial prostatitis may be caused by:  Urinary tract infections (UTIs).  Nerve damage.  A response by the body's disease-fighting system (autoimmune response).  Chemicals in the urine.  The causes of the other types of prostatitis are usually not known. What are the signs or symptoms? Symptoms of this condition vary depending upon the type of prostatitis. If you have acute bacterial prostatitis, you may experience:  Urinary symptoms, such as: ? Painful urination. ? Burning during  urination. ? Frequent and sudden urges to urinate. ? Inability to start urinating. ? A weak or interrupted stream of urine.  Vomiting.  Nausea.  Fever.  Chills.  Inability to empty the bladder completely.  Pain in the: ? Muscles or joints. ? Lower back. ? Lower abdomen.  If you have any of the other types of prostatitis, you may experience:  Urinary symptoms, such as: ? Sudden urges to urinate. ? Frequent urination. ? Difficulty starting urination. ? Weak urine stream. ? Dribbling after urination.  Discharge from the urethra. The urethra is a tube that opens at the end of the penis.  Pain in the: ? Testicles. ? Penis or tip of the penis. ? Rectum. ? Area in front of the rectum and below the scrotum (perineum).  Problems with sexual function.  Painful ejaculation.  Bloody semen.  How is this diagnosed? This condition may be diagnosed based on:  A physical and medical exam.  Your symptoms.  A urine test to check for bacteria.  An exam in which a health care provider uses a finger to feel the prostate (digital rectal exam).  A test of a sample of semen.  Blood tests.  Ultrasound.  Removal of prostate tissue to be examined under a microscope (biopsy).  Tests to check how your body handles urine (urodynamic tests).  A test to look inside your bladder or urethra (cystoscopy).  How is this treated? Treatment for this condition depends on the type of prostatitis. Treatment may involve:  Medicines to relieve pain or inflammation.  Medicines to help relax your muscles.  Physical therapy.  Heat therapy.  Techniques to help you control  certain body functions (biofeedback).  Relaxation exercises.  Antibiotic medicine, if your condition is caused by bacteria.  Warm water baths (sitz baths). Sitz baths help with relaxing your pelvic floor muscles, which helps to relieve pressure on the prostate.  Follow these instructions at home:  Take  over-the-counter and prescription medicines only as told by your health care provider.  If you were prescribed an antibiotic, take it as told by your health care provider. Do not stop taking the antibiotic even if you start to feel better.  If physical therapy, biofeedback, or relaxation exercises were prescribed, do exercises as instructed.  Take sitz baths as directed by your health care provider. For a sitz bath, sit in warm water that is deep enough to cover your hips and buttocks.  Keep all follow-up visits as told by your health care provider. This is important. Contact a health care provider if:  Your symptoms get worse.  You have a fever. Get help right away if:  You have chills.  You feel nauseous.  You vomit.  You feel light-headed or feel like you are going to faint.  You are unable to urinate.  You have blood or blood clots in your urine. This information is not intended to replace advice given to you by your health care provider. Make sure you discuss any questions you have with your health care provider. Document Released: 03/27/2000 Document Revised: 12/19/2015 Document Reviewed: 12/19/2015 Elsevier Interactive Patient Education  2017 [employer].

## 2018-03-24 ENCOUNTER — Other Ambulatory Visit: Payer: Self-pay | Admitting: Family Medicine

## 2018-03-25 ENCOUNTER — Encounter: Payer: Self-pay | Admitting: Family Medicine

## 2018-03-25 ENCOUNTER — Ambulatory Visit (INDEPENDENT_AMBULATORY_CARE_PROVIDER_SITE_OTHER): Payer: 59 | Admitting: Family Medicine

## 2018-03-25 VITALS — BP 126/96 | HR 66 | Ht 73.0 in | Wt 215.0 lb

## 2018-03-25 DIAGNOSIS — N4281 Prostatodynia syndrome: Secondary | ICD-10-CM | POA: Diagnosis not present

## 2018-03-25 MED ORDER — CIPROFLOXACIN HCL 500 MG PO TABS: 500.0000 mg | ORAL_TABLET | Freq: Two times a day (BID) | ORAL | 0 refills | Status: DC

## 2018-03-25 NOTE — Progress Notes (Signed)
Darrell Contreras is a 57 y.o. male who presents to [facility]: [place] today for follow-up pelvic pressure and pain.  Patient was seen last week for pelvic pressure and pain thought to be chronic prostatitis.  He was treated empirically with a 7-day course of Cipro.  He notes he is feeling a lot better but not completely resolved.  He somehow managed to spill some of the Cipro and is run out.  He would like to try a 2-week course and is done some reading and is worried about the possibility of this being chronic prostatitis requiring longer courses of antibiotics.   ROS as above:  Exam:  BP (!) 126/96   Pulse 66   Ht 6\' 1"  (1.854 m)   Wt 215 lb (97.5 kg)   BMI 28.37 kg/m  Wt Readings from Last 5 Encounters:  03/25/18 215 lb (97.5 kg)  03/17/18 214 lb (97.1 kg)  10/27/17 214 lb (97.1 kg)  10/19/17 216 lb (98 kg)  09/30/17 211 lb (95.7 kg)    Gen: Well NAD HEENT: EOMI,  MMM Lungs: Normal work of breathing. CTABL Heart: RRR no MRG Abd: NABS, Soft. Nondistended, Nontender Exts: Brisk capillary refill, warm and well perfused.   Lab and Radiology Results No results found for this or any previous visit (from the past 72 hour(s)). No results found.    Assessment and Plan: 57 y.o. male with pelvic pain.  Very likely acute versus chronic prostatitis.  Plan to complete a two-week course of Cipro.  If symptoms return we will try a 6-8 week of course of medications.  Hold on CT scan at this time. Recheck as needed.  I spent 15 minutes with this patient, greater than 50% was face-to-face time counseling regarding differential diagnosis and treatment plan and options..    No orders of the defined types were placed in this encounter.  No orders of the defined types were placed in this encounter.    Historical information moved to improve visibility of documentation.  Past  Medical History:  Diagnosis Date  . Abnormal ECG during exercise stress test 11/09/2016  . Abnormal stress ECG    a. 08/2016: stress test showing 2 mm horizontal ST depression in the inferior and lateral leads with occasional PVC's, overall findings suggestive of ischemia but possible repol abnormality.  . Prostatitis   . Rosacea   . Seizures ([place])    states had seizure when IV was put in 15 years ago   Past Surgical History:  Procedure Laterality Date  . WISDOM TOOTH EXTRACTION     Social History   Tobacco Use  . Smoking status: Never Smoker  . Smokeless tobacco: Never Used  Substance Use Topics  . Alcohol use: Yes    Alcohol/week: 0.0 standard drinks    Comment: d/c alcohol around 09-19-17 per pt   family history includes Aneurysm in his father; Cancer (age of onset: 81) in his mother; Cancer (age of onset: 58) in his father.  Medications: Current Outpatient Medications  Medication Sig Dispense Refill  . Azelaic Acid (FINACEA) 15 % cream After skin is thoroughly washed and patted dry, gently but thoroughly massage a thin film of azelaic acid gel into the affected area twice daily, in the morning and evening. 50 g 12  . cyclobenzaprine (FLEXERIL) 10 MG tablet Take 1 tablet (10 mg total) by mouth 3 (three) times daily as needed for muscle spasms. 30 tablet 2  .  valACYclovir (VALTREX) 1000 MG tablet Take 2 tablets (2,000 mg total) by mouth 2 (two) times daily. For coldsore at onset of symptoms (Patient taking differently: Take 2,000 mg by mouth 2 (two) times daily as needed. For coldsore at onset of symptoms) 4 tablet 2   No current facility-administered medications for this visit.    No Known Allergies   Discussed warning signs or symptoms. Please see discharge instructions. Patient expresses understanding.

## 2018-03-25 NOTE — Patient Instructions (Signed)
Thank you for coming in today.  Continue cipro for a total of around 2 weeks or so.  If symptoms continue after completion of the antibiotic let me know. We may consider a 2 month trial of cipro.   Let me know if not doing well.    Prostatitis Prostatitis is swelling or inflammation of the prostate gland. The prostate is a walnut-sized gland that is involved in the production of semen. It is located below a man's bladder, in front of the rectum. There are four types of prostatitis:  Chronic nonbacterial prostatitis. This is the most common type of prostatitis. It may be associated with a viral infection or autoimmune disorder.  Acute bacterial prostatitis. This is the least common type of prostatitis. It starts quickly and is usually associated with a bladder infection, high fever, and shaking chills. It can occur at any age.  Chronic bacterial prostatitis. This type usually results from acute bacterial prostatitis that happens repeatedly (is recurrent) or has not been treated properly. It can occur in men of any age but is most common among middle-aged men whose prostate has begun to get larger. The symptoms are not as severe as symptoms caused by acute bacterial prostatitis.  Prostatodynia or chronic pelvic pain syndrome (CPPS). This type is also called pelvic floor disorder. It is associated with increased muscular tone in the pelvis surrounding the prostate.  What are the causes? Bacterial prostatitis is caused by infection from bacteria. Chronic nonbacterial prostatitis may be caused by:  Urinary tract infections (UTIs).  Nerve damage.  A response by the body's disease-fighting system (autoimmune response).  Chemicals in the urine.  The causes of the other types of prostatitis are usually not known. What are the signs or symptoms? Symptoms of this condition vary depending upon the type of prostatitis. If you have acute bacterial prostatitis, you may experience:  Urinary  symptoms, such as: ? Painful urination. ? Burning during urination. ? Frequent and sudden urges to urinate. ? Inability to start urinating. ? A weak or interrupted stream of urine.  Vomiting.  Nausea.  Fever.  Chills.  Inability to empty the bladder completely.  Pain in the: ? Muscles or joints. ? Lower back. ? Lower abdomen.  If you have any of the other types of prostatitis, you may experience:  Urinary symptoms, such as: ? Sudden urges to urinate. ? Frequent urination. ? Difficulty starting urination. ? Weak urine stream. ? Dribbling after urination.  Discharge from the urethra. The urethra is a tube that opens at the end of the penis.  Pain in the: ? Testicles. ? Penis or tip of the penis. ? Rectum. ? Area in front of the rectum and below the scrotum (perineum).  Problems with sexual function.  Painful ejaculation.  Bloody semen.  How is this diagnosed? This condition may be diagnosed based on:  A physical and medical exam.  Your symptoms.  A urine test to check for bacteria.  An exam in which a health care provider uses a finger to feel the prostate (digital rectal exam).  A test of a sample of semen.  Blood tests.  Ultrasound.  Removal of prostate tissue to be examined under a microscope (biopsy).  Tests to check how your body handles urine (urodynamic tests).  A test to look inside your bladder or urethra (cystoscopy).  How is this treated? Treatment for this condition depends on the type of prostatitis. Treatment may involve:  Medicines to relieve pain or inflammation.  Medicines to help  relax your muscles.  Physical therapy.  Heat therapy.  Techniques to help you control certain body functions (biofeedback).  Relaxation exercises.  Antibiotic medicine, if your condition is caused by bacteria.  Warm water baths (sitz baths). Sitz baths help with relaxing your pelvic floor muscles, which helps to relieve pressure on the  prostate.  Follow these instructions at home:  Take over-the-counter and prescription medicines only as told by your health care provider.  If you were prescribed an antibiotic, take it as told by your health care provider. Do not stop taking the antibiotic even if you start to feel better.  If physical therapy, biofeedback, or relaxation exercises were prescribed, do exercises as instructed.  Take sitz baths as directed by your health care provider. For a sitz bath, sit in warm water that is deep enough to cover your hips and buttocks.  Keep all follow-up visits as told by your health care provider. This is important. Contact a health care provider if:  Your symptoms get worse.  You have a fever. Get help right away if:  You have chills.  You feel nauseous.  You vomit.  You feel light-headed or feel like you are going to faint.  You are unable to urinate.  You have blood or blood clots in your urine. This information is not intended to replace advice given to you by your health care provider. Make sure you discuss any questions you have with your health care provider. Document Released: 03/27/2000 Document Revised: 12/19/2015 Document Reviewed: 12/19/2015 Elsevier Interactive Patient Education  2017 [employer].

## 2018-03-27 ENCOUNTER — Encounter: Payer: Self-pay | Admitting: Family Medicine

## 2018-03-27 DIAGNOSIS — R102 Pelvic and perineal pain: Secondary | ICD-10-CM

## 2018-03-30 ENCOUNTER — Inpatient Hospital Stay: Admission: RE | Admit: 2018-03-30 | Payer: 59 | Source: Ambulatory Visit

## 2018-03-30 ENCOUNTER — Ambulatory Visit (INDEPENDENT_AMBULATORY_CARE_PROVIDER_SITE_OTHER): Payer: 59

## 2018-03-30 DIAGNOSIS — N433 Hydrocele, unspecified: Secondary | ICD-10-CM | POA: Diagnosis not present

## 2018-03-30 DIAGNOSIS — I861 Scrotal varices: Secondary | ICD-10-CM | POA: Diagnosis not present

## 2018-03-30 DIAGNOSIS — R102 Pelvic and perineal pain: Secondary | ICD-10-CM

## 2018-07-19 ENCOUNTER — Encounter: Payer: Self-pay | Admitting: Family Medicine

## 2018-10-03 NOTE — Telephone Encounter (Signed)
Pt was scheduled for CT of A/P in December and cancelled the appt. Pt is calling to reschedule the scan. Please advise if pt need another OV or is it ok to reschedule scan.

## 2018-10-03 NOTE — Telephone Encounter (Signed)
Patient called said he would like to resch his CT abd/pelvis [place] CT

## 2018-10-04 ENCOUNTER — Other Ambulatory Visit: Payer: Self-pay

## 2018-10-04 ENCOUNTER — Telehealth: Payer: Self-pay | Admitting: Internal Medicine

## 2018-10-04 DIAGNOSIS — R1033 Periumbilical pain: Secondary | ICD-10-CM

## 2018-10-04 NOTE — Telephone Encounter (Signed)
Pt would like to r/s Ct scan that was cancelled last December. Pt wants to know if he could have it done at [facility] where his PCP (Dr. Georgina Snell) is located.He stated that they can get him in soon. Pls call him.

## 2018-10-04 NOTE — Telephone Encounter (Signed)
Pt scheduled for CT of A/P at [facility] CT 10/21/18@10 :45am. Pt to be NPO after 6:45am, drink bottle 1 of contrast at 8:45am, bottle 2 at 9:45am. Pt aware of appt. Instructed him to pick up contrast either from [place] or our office, if our office requested he call back and let us know.

## 2018-10-04 NOTE — Telephone Encounter (Signed)
If symptoms same as described when he was seen by Ellouise Newer, PA-C than we can reorder CT abdomen and pelvis with contrast If labs are needed please check CBC and CMP

## 2018-10-04 NOTE — Telephone Encounter (Signed)
See additional phone note. 

## 2018-10-20 ENCOUNTER — Telehealth: Payer: Self-pay | Admitting: Family Medicine

## 2018-10-20 NOTE — Telephone Encounter (Signed)
Script Screening patients for COVID-19 and reviewing new operational procedures  Greeting - The reason I am calling is to share with you some new changes to our processes that are designed to help [place] keep everyone safe. Is now a good time to speak with you? Patient says "no' - ask them when you can call back and let them know it's important to do this prior to their appointment.  Patient says "yes" - [place], Worrell the first thing I need to do is ask you some screening Questions.  1. To the best of your knowledge, have you been in close contact with any one with a confirmed diagnosis of COVID 19? o No - proceed to next question  2. Have you had any one or more of the following: fever, chills, cough, shortness of breath or any flu-like symptoms? o No - proceed to next question  3. Have you been diagnosed with or have a previous diagnosis of COVID 19? o No - proceed to next question  4. I am going to go over a few other symptoms with you. Please let me know if you are experiencing any of the following: . Ear, nose or throat discomfort . A sore throat . Headache . Muscle pain . Diarrhea . Loss of taste or smell o No - proceed to next question  Thank you for answering these questions. Please know we will ask you these questions or similar questions when you arrive for your appointment and again it's how we are keeping everyone safe. Also, to keep you safe, please use the provided hand sanitizer when you enter the building. Percell Miller, we are asking everyone in the building to wear a mask because they help [place] prevent the spread of germs. Do you have a mask of your own, if not, we are happy to provide one for you. The last thing I want to go over with you is the no visitor guidelines. This means no one can attend the appointment with you unless you need physical assistance. I understand this may be different from your past appointments and I know this may be difficult but please know if  someone is driving you we are happy to call them for you once your appointment is over.  [INSERT Wilson  (Insert pt name) I've given you a lot of information, what questions do you have about what I've talked about today or your appointment tomorrow?  Benjie Karvonen, CMA

## 2018-10-21 ENCOUNTER — Other Ambulatory Visit: Payer: Self-pay

## 2018-10-21 ENCOUNTER — Ambulatory Visit (INDEPENDENT_AMBULATORY_CARE_PROVIDER_SITE_OTHER)
Admission: RE | Admit: 2018-10-21 | Discharge: 2018-10-21 | Disposition: A | Discharge status: Home / Self Care | Payer: 59 | Source: Ambulatory Visit | Attending: Internal Medicine | Admitting: Internal Medicine

## 2018-10-21 DIAGNOSIS — R1033 Periumbilical pain: Secondary | ICD-10-CM

## 2018-10-21 MED ORDER — IOHEXOL 300 MG/ML  SOLN
100.0000 mL | Freq: Once | INTRAMUSCULAR | Status: AC | PRN
  Administered 2018-10-21: 100 mL via INTRAVENOUS

## 2018-10-25 ENCOUNTER — Encounter: Payer: Self-pay | Admitting: Family Medicine

## 2019-01-09 ENCOUNTER — Encounter: Payer: Self-pay | Admitting: Family Medicine

## 2019-01-10 MED ORDER — VALACYCLOVIR HCL 1 G PO TABS: 2000.0000 mg | ORAL_TABLET | Freq: Two times a day (BID) | ORAL | 2 refills | Status: DC

## 2019-01-19 ENCOUNTER — Encounter: Payer: Self-pay | Admitting: Family Medicine

## 2019-02-03 ENCOUNTER — Encounter: Payer: Self-pay | Admitting: Family Medicine

## 2019-02-03 NOTE — Telephone Encounter (Signed)
Left patient a voicemail with information below. Let patient know to call us back to schedule an appointment. 

## 2019-02-06 ENCOUNTER — Telehealth: Payer: Self-pay | Admitting: Family Medicine

## 2019-02-06 DIAGNOSIS — Z0000 Encounter for general adult medical examination without abnormal findings: Secondary | ICD-10-CM

## 2019-02-06 DIAGNOSIS — N401 Enlarged prostate with lower urinary tract symptoms: Secondary | ICD-10-CM

## 2019-02-06 DIAGNOSIS — Z13220 Encounter for screening for lipoid disorders: Secondary | ICD-10-CM

## 2019-02-06 DIAGNOSIS — Z87438 Personal history of other diseases of male genital organs: Secondary | ICD-10-CM

## 2019-02-06 NOTE — Telephone Encounter (Signed)
Labs pended.

## 2019-02-06 NOTE — Telephone Encounter (Signed)
Pt has an appointment for a physical on Friday and would like to get lab order sent down. Thank you.

## 2019-02-06 NOTE — Telephone Encounter (Signed)
Pt aware.     Thank you

## 2019-02-06 NOTE — Telephone Encounter (Signed)
Labs ordered.  Labs should be done fasting.

## 2019-02-07 ENCOUNTER — Encounter: Payer: 59 | Admitting: Family Medicine

## 2019-02-08 LAB — COMPLETE METABOLIC PANEL WITH GFR
AG Ratio: 1.8 (calc) (ref 1.0–2.5)
ALT: 17 U/L (ref 9–46)
AST: 22 U/L (ref 10–35)
Albumin: 4.3 g/dL (ref 3.6–5.1)
Alkaline phosphatase (APISO): 60 U/L (ref 35–144)
BUN: 14 mg/dL (ref 7–25)
CO2: 28 mmol/L (ref 20–32)
Calcium: 9.7 mg/dL (ref 8.6–10.3)
Chloride: 106 mmol/L (ref 98–110)
Creat: 1.06 mg/dL (ref 0.70–1.33)
GFR, Est African American: 89 mL/min/{1.73_m2} (ref >=60)
GFR, Est Non African American: 77 mL/min/{1.73_m2} (ref >=60)
Globulin: 2.4 g/dL (calc) (ref 1.9–3.7)
Glucose, Bld: 112 mg/dL — ABNORMAL HIGH (ref 65–99)
Potassium: 4.1 mmol/L (ref 3.5–5.3)
Sodium: 141 mmol/L (ref 135–146)
Total Bilirubin: 0.9 mg/dL (ref 0.2–1.2)
Total Protein: 6.7 g/dL (ref 6.1–8.1)

## 2019-02-08 LAB — CBC
HCT: 42.2 % (ref 38.5–50.0)
Hemoglobin: 14.3 g/dL (ref 13.2–17.1)
MCH: 30.2 pg (ref 27.0–33.0)
MCHC: 33.9 g/dL (ref 32.0–36.0)
MCV: 89 fL (ref 80.0–100.0)
MPV: 10.9 fL (ref 7.5–12.5)
Platelets: 207 10*3/uL (ref 140–400)
RBC: 4.74 10*6/uL (ref 4.20–5.80)
RDW: 12.8 % (ref 11.0–15.0)
WBC: 5.1 10*3/uL (ref 3.8–10.8)

## 2019-02-08 LAB — LIPID PANEL W/REFLEX DIRECT LDL
Cholesterol: 174 mg/dL (ref <200)
HDL: 40 mg/dL (ref >=40)
LDL Cholesterol (Calc): 107 mg/dL (calc) — ABNORMAL HIGH
Non-HDL Cholesterol (Calc): 134 mg/dL (calc) — ABNORMAL HIGH (ref <130)
Total CHOL/HDL Ratio: 4.4 (calc) (ref <5.0)
Triglycerides: 154 mg/dL — ABNORMAL HIGH (ref <150)

## 2019-02-08 LAB — PSA: PSA: 0.3 ng/mL (ref <=4.0)

## 2019-02-10 ENCOUNTER — Other Ambulatory Visit: Payer: Self-pay

## 2019-02-10 ENCOUNTER — Encounter: Payer: Self-pay | Admitting: Family Medicine

## 2019-02-10 ENCOUNTER — Ambulatory Visit (INDEPENDENT_AMBULATORY_CARE_PROVIDER_SITE_OTHER): Payer: 59 | Admitting: Family Medicine

## 2019-02-10 VITALS — BP 137/88 | HR 65 | Temp 97.2°F | Wt 215.0 lb

## 2019-02-10 DIAGNOSIS — N401 Enlarged prostate with lower urinary tract symptoms: Secondary | ICD-10-CM

## 2019-02-10 DIAGNOSIS — Z0000 Encounter for general adult medical examination without abnormal findings: Secondary | ICD-10-CM

## 2019-02-10 DIAGNOSIS — E663 Overweight: Secondary | ICD-10-CM | POA: Diagnosis not present

## 2019-02-10 DIAGNOSIS — E782 Mixed hyperlipidemia: Secondary | ICD-10-CM

## 2019-02-10 DIAGNOSIS — E785 Hyperlipidemia, unspecified: Secondary | ICD-10-CM | POA: Insufficient documentation

## 2019-02-10 MED ORDER — ZOSTER VAC RECOMB ADJUVANTED 50 MCG/0.5ML IM SUSR: 0.5000 mL | Freq: Once | INTRAMUSCULAR | 1 refills | Status: AC

## 2019-02-10 MED ORDER — AZELAIC ACID 15 % EX GEL: CUTANEOUS | 12 refills | Status: DC

## 2019-02-10 NOTE — Progress Notes (Signed)
Darrell Contreras is a 58 y.o. male who presents to [facility] today for well adult visit.   Darrell Contreras is doing well with no acute issues.  He notes that he will be soon due for his 3-year follow-up for colonoscopy for colon cancer screening but would like to delay it due to COVID-19.  He gets some exercise and is working on eating a lower carbohydrate healthy diet.  He feels well overall.   ROS as above:  Past Medical History:  Diagnosis Date  . Abnormal ECG during exercise stress test 11/09/2016  . Abnormal stress ECG    a. 08/2016: stress test showing 2 mm horizontal ST depression in the inferior and lateral leads with occasional PVC's, overall findings suggestive of ischemia but possible repol abnormality.  . Prostatitis   . Rosacea   . Seizures ([place])    states had seizure when IV was put in 15 years ago   Past Surgical History:  Procedure Laterality Date  . WISDOM TOOTH EXTRACTION     Social History   Tobacco Use  . Smoking status: Never Smoker  . Smokeless tobacco: Never Used  Substance Use Topics  . Alcohol use: Yes    Alcohol/week: 0.0 standard drinks    Comment: d/c alcohol around 09-19-17 per pt   family history includes Aneurysm in his father; Cancer (age of onset: 72) in his mother; Cancer (age of onset: 38) in his father.  Medications: Current Outpatient Medications  Medication Sig Dispense Refill  . Azelaic Acid (FINACEA) 15 % cream After skin is thoroughly washed and patted dry, gently but thoroughly massage a thin film of azelaic acid gel into the affected area twice daily, in the morning and evening. 50 g 12  . valACYclovir (VALTREX) 1000 MG tablet Take 2 tablets (2,000 mg total) by mouth 2 (two) times daily. For coldsore at onset of symptoms 30 tablet 2  . Zoster Vaccine Adjuvanted [facility]) injection Inject 0.5 mLs into the muscle once for 1 dose.  0.5 mL 1   No current facility-administered medications for this visit.    No Known Allergies  Health Maintenance Health Maintenance  Topic Date Due  . Hepatitis C Screening  12/12/2028 (Originally [birth year])  . COLONOSCOPY  04/03/2019  . TETANUS/TDAP  01/18/2029  . INFLUENZA VACCINE  Completed  . HIV Screening  Completed     Exam:  BP 137/88   Pulse 65   Temp (!) 97.2 F (36.2 C) (Oral)   Wt 215 lb (97.5 kg)   BMI 28.37 kg/m  Wt Readings from Last 5 Encounters:  02/10/19 215 lb (97.5 kg)  03/25/18 215 lb (97.5 kg)  03/17/18 214 lb (97.1 kg)  10/27/17 214 lb (97.1 kg)  10/19/17 216 lb (98 kg)      Gen: Well NAD HEENT: EOMI,  MMM Lungs: Normal work of breathing. CTABL Heart: RRR no MRG Abd: NABS, Soft. Nondistended, Nontender Exts: Brisk capillary refill, warm and well perfused.  Psych: Alert and oriented normal speech thought process and affect.  Depression screen [facility] 2/9 02/10/2019 09/30/2017 12/23/2016  Decreased Interest 0 1 0  Down, Depressed, Hopeless 0 1 0  PHQ - 2 Score 0 2 0  Altered sleeping 0 0 -  Tired, decreased energy 0 0 -  Change in appetite 0 0 -  Feeling bad or failure about yourself  0 0 -  Trouble concentrating 0 0 -  Moving slowly or fidgety/restless 0 0 -  Suicidal thoughts 0 0 -  PHQ-9 Score 0 2 -  Difficult doing work/chores Not difficult at all Not difficult at all -       Lab and Radiology Results Recent Results (from the past 2160 hour(s))  CBC     Status: None   Collection Time: 02/07/19  8:08 AM  Result Value Ref Range   WBC 5.1 3.8 - 10.8 Thousand/uL   RBC 4.74 4.20 - 5.80 Million/uL   Hemoglobin 14.3 13.2 - 17.1 g/dL   HCT 42.2 38.5 - 50.0 %   MCV 89.0 80.0 - 100.0 fL   MCH 30.2 27.0 - 33.0 pg   MCHC 33.9 32.0 - 36.0 g/dL   RDW 12.8 11.0 - 15.0 %   Platelets 207 140 - 400 Thousand/uL   MPV 10.9 7.5 - 12.5 fL  COMPLETE METABOLIC PANEL WITH GFR     Status: Abnormal   Collection Time: 02/07/19  8:08 AM  Result  Value Ref Range   Glucose, Bld 112 (H) 65 - 99 mg/dL    Comment: .            Fasting reference interval . For someone without known diabetes, a glucose value between 100 and 125 mg/dL is consistent with prediabetes and should be confirmed with a follow-up test. .    BUN 14 7 - 25 mg/dL   Creat 1.06 0.70 - 1.33 mg/dL    Comment: For patients >10 years of age, the reference limit for Creatinine is approximately 13% higher for people identified as African-American. .    GFR, Est Non African American 77 > OR = 60 mL/min/1.32m2   GFR, Est African American 89 > OR = 60 mL/min/1.77m2   BUN/Creatinine Ratio NOT APPLICABLE 6 - 22 (calc)   Sodium 141 135 - 146 mmol/L   Potassium 4.1 3.5 - 5.3 mmol/L   Chloride 106 98 - 110 mmol/L   CO2 28 20 - 32 mmol/L   Calcium 9.7 8.6 - 10.3 mg/dL   Total Protein 6.7 6.1 - 8.1 g/dL   Albumin 4.3 3.6 - 5.1 g/dL   Globulin 2.4 1.9 - 3.7 g/dL (calc)   AG Ratio 1.8 1.0 - 2.5 (calc)   Total Bilirubin 0.9 0.2 - 1.2 mg/dL   Alkaline phosphatase (APISO) 60 35 - 144 U/L   AST 22 10 - 35 U/L   ALT 17 9 - 46 U/L  Lipid Panel w/reflex Direct LDL     Status: Abnormal   Collection Time: 02/07/19  8:08 AM  Result Value Ref Range   Cholesterol 174 <200 mg/dL   HDL 40 > OR = 40 mg/dL   Triglycerides 154 (H) <150 mg/dL   LDL Cholesterol (Calc) 107 (H) mg/dL (calc)    Comment: Reference range: <100 . Desirable range <100 mg/dL for primary prevention;   <70 mg/dL for patients with CHD or diabetic patients  with > or = 2 CHD risk factors. Marland Kitchen LDL-C is now calculated using the Martin-Hopkins  calculation, which is a validated novel method providing  better accuracy than the Friedewald equation in the  estimation of LDL-C.  Cresenciano Genre et al. Annamaria Helling. [id number]): 2061-2068  (http://education.Quest[employer].com/faq/FAQ164)    Total CHOL/HDL Ratio 4.4 <5.0 (calc)   Non-HDL Cholesterol (Calc) 134 (H) <130 mg/dL (calc)    Comment: For patients with diabetes plus  1 major ASCVD risk  factor, treating to a non-HDL-C goal of <100 mg/dL  (LDL-C of <70 mg/dL) is considered a therapeutic  option.   PSA  Status: None   Collection Time: 02/07/19  8:08 AM  Result Value Ref Range   PSA 0.3 < OR = 4.0 ng/mL    Comment: The total PSA value from this assay system is  standardized against the WHO standard. The test  result will be approximately 20% lower when compared  to the equimolar-standardized total PSA (Beckman  Coulter). Comparison of serial PSA results should be  interpreted with this fact in mind. . This test was performed using the Siemens  chemiluminescent method. Values obtained from  different assay methods cannot be used interchangeably. PSA levels, regardless of value, should not be interpreted as absolute evidence of the presence or absence of disease.      The 10-year ASCVD risk score Mikey Bussing DC Brooke Bonito., et al., 2013) is: 8.5%   Values used to calculate the score:     Age: 69 years     Sex: Male     Is Non-Hispanic African American: No     Diabetic: No     Tobacco smoker: No     Systolic Blood Pressure: 0000000 mmHg     Is BP treated: No     HDL Cholesterol: 40 mg/dL     Total Cholesterol: 174 mg/dL  No results found.    Assessment and Plan: 58 y.o. male with well adult.  Doing reasonably well.  Discussed healthy lifestyle.  Exercise and improve diet.  Discussed ASCVD risk.  His risk is 8.5%.  After discussion he would like to hold off on statins but is willing to consider them in the future if needed.  Recheck yearly if all is well.  Return sooner if needed.  Precautions reviewed.  Shingrix vaccine not available in clinic.  Prescription sent to pharmacy.  If not able to get it there he can schedule nurse visit for Shingrix vaccine in the near future.  PDMP not reviewed this encounter. No orders of the defined types were placed in this encounter.  Meds ordered this encounter  Medications  . Azelaic Acid (FINACEA) 15 %  cream    Sig: After skin is thoroughly washed and patted dry, gently but thoroughly massage a thin film of azelaic acid gel into the affected area twice daily, in the morning and evening.    Dispense:  50 g    Refill:  12    FILL GEL  . Zoster Vaccine Adjuvanted [facility]) injection    Sig: Inject 0.5 mLs into the muscle once for 1 dose.    Dispense:  0.5 mL    Refill:  1     Discussed warning signs or symptoms. Please see discharge instructions. Patient expresses understanding.

## 2019-02-10 NOTE — Patient Instructions (Addendum)
Thank you for coming in today. Consider Pravastatin for cholesterol.  Recheck yearly if all is well.  Return in 2 months for 2nd Shingrix vaccine via nurse visit.   Continue to get some exercise and eat well.  Keep carbs low.   Recommend Colonoscopy in the next 12 months.   I will be moving to full time Sports Medicine in [place] starting on November 2nd   You will still be able to see me for your Sports Medicine or Orthopedic needs at [facility] in [place]. I will still be part of [facility].    If you want to stay locally for your Sports Medicine issues Dr. Dianah Field here in [place] will be happy to see you.  Additionally Dr. Clearance Coots at [facility] will be happy to see you for sports medicine issues more locally.   For your primary care needs you are welcome to establish care with Dr. Emeterio Reeve.  Dr Luetta Nutting (Starting in February) will be starting in the new year and a new NP Joy (Starting in December).  We are working quickly to hire more physicians to cover the primary care needs however if you cannot get an appointment with Dr. Sheppard Coil in a timely manner [place] has locations and openings for primary care services nearby.   [facility] at [facility] [address] . [employer] , [place]: [phone] . Behavioral Medicine: [phone] . Fax: [place] at [employer] [address] . [place], [place]: [phone] . Behavioral Medicine: [phone] . Fax: [phone] . Hours (M-F): 7am - Academic librarian At [facility]. [place], [place]: [phone] . Behavioral Medicine: [phone] . Fax: [phone] . Hours (M-F): 8am - Optician, dispensing at [employer] . [place], [place] Phone: [phone] . Behavioral Medicine:  [phone] . Fax: [phone]

## 2019-03-30 ENCOUNTER — Encounter: Payer: Self-pay | Admitting: Internal Medicine

## 2019-05-29 ENCOUNTER — Encounter: Payer: Self-pay | Admitting: Family Medicine

## 2019-05-29 DIAGNOSIS — N401 Enlarged prostate with lower urinary tract symptoms: Secondary | ICD-10-CM

## 2019-05-29 DIAGNOSIS — N4281 Prostatodynia syndrome: Secondary | ICD-10-CM

## 2019-06-21 ENCOUNTER — Telehealth (INDEPENDENT_AMBULATORY_CARE_PROVIDER_SITE_OTHER): Payer: 59 | Admitting: Family Medicine

## 2019-06-21 ENCOUNTER — Encounter: Payer: Self-pay | Admitting: Family Medicine

## 2019-06-21 DIAGNOSIS — G4700 Insomnia, unspecified: Secondary | ICD-10-CM | POA: Insufficient documentation

## 2019-06-21 DIAGNOSIS — F5102 Adjustment insomnia: Secondary | ICD-10-CM | POA: Diagnosis not present

## 2019-06-21 DIAGNOSIS — N529 Male erectile dysfunction, unspecified: Secondary | ICD-10-CM | POA: Diagnosis not present

## 2019-06-21 MED ORDER — TADALAFIL 20 MG PO TABS: 10.0000 mg | ORAL_TABLET | ORAL | 3 refills | Status: DC | PRN

## 2019-06-21 MED ORDER — ZOLPIDEM TARTRATE 5 MG PO TABS: 5.0000 mg | ORAL_TABLET | Freq: Every evening | ORAL | 1 refills | Status: DC | PRN

## 2019-06-21 NOTE — Assessment & Plan Note (Signed)
Discussed use of short term ambien to manage insomnia.  Start at 5mg  and can titrate if needed based on response.  Discussed side effects of medication, he expresses understanding.  F/u in 1 month.

## 2019-06-21 NOTE — Assessment & Plan Note (Signed)
Rx for tadalafil sent to [facility]

## 2019-06-21 NOTE — Progress Notes (Signed)
Darrell Contreras - 59 y.o. male MRN [id number]  Date of birth: [birth year]   This visit type was conducted due to national recommendations for restrictions regarding the COVID-19 Pandemic (e.g. social distancing).  This format is felt to be most appropriate for this patient at this time.  All issues noted in this document were discussed and addressed.  No physical exam was performed (except for noted visual exam findings with Video Visits).  I discussed the limitations of evaluation and management by telemedicine and the availability of in person appointments. The patient expressed understanding and agreed to proceed.  I connected with@ on 06/21/19 at  1:00 PM EST by a video enabled telemedicine application and verified that I am speaking with the correct person using two identifiers.  Present at visit: Luetta Nutting, DO Duanne Limerick   Patient Location: [place]  [id number]   Provider location:   [place]  No chief complaint on file.   HPI  Darrell Contreras is a 59 y.o. male who presents via audio/video conferencing for a telehealth visit today.  He has complaint of insomnia today.  He reports that his wife has filed for separation recently.  They are currently still living together while he looks for apartment.  He has had trouble sleeping at night due to anxiety related to this.  He has tried OTC sleep aid without much relief.  He has never tried any prescription sleep aids in the past.  He denies depressive symptoms at this time.    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  He has used tadalafil in the past to help with his erections and this worked well for him.  He requests new rx for this.     ROS:  A comprehensive ROS was completed and negative except as noted per HPI  Past Medical History:  Diagnosis Date  . Abnormal ECG during exercise stress test 11/09/2016  . Abnormal stress ECG    a. 08/2016: stress test showing 2 mm  horizontal ST depression in the inferior and lateral leads with occasional PVC's, overall findings suggestive of ischemia but possible repol abnormality.  . Prostatitis   . Rosacea   . Seizures ([place])    states had seizure when IV was put in 15 years ago    Past Surgical History:  Procedure Laterality Date  . WISDOM TOOTH EXTRACTION      Family History  Problem Relation Age of Onset  . Cancer Mother 25       breast cancer  . Cancer Father 39       skin cancer  . Aneurysm Father   . Colon cancer Neg Hx   . Esophageal cancer Neg Hx   . Rectal cancer Neg Hx   . Stomach cancer Neg Hx     Social History   Socioeconomic History  . Marital status: Married    Spouse name: Not on file  . Number of children: 5  . Years of education: Not on file  . Highest education level: Not on file  Occupational History    Comment: Software  Tobacco Use  . Smoking status: Never Smoker  . Smokeless tobacco: Never Used  Substance and Sexual Activity  . Alcohol use: Yes    Alcohol/week: 0.0 standard drinks    Comment: d/c alcohol around 09-19-17 per pt  . Drug use: No  . Sexual activity: Not on file  Other Topics Concern  . Not on file  Social History Narrative  . Not on file   Social Determinants of Health   Financial Resource Strain:   . Difficulty of Paying Living Expenses: Not on file  Food Insecurity:   . Worried About Charity fundraiser in the Last Year: Not on file  . Ran Out of Food in the Last Year: Not on file  Transportation Needs:   . Lack of Transportation (Medical): Not on file  . Lack of Transportation (Non-Medical): Not on file  Physical Activity:   . Days of Exercise per Week: Not on file  . Minutes of Exercise per Session: Not on file  Stress:   . Feeling of Stress : Not on file  Social Connections:   . Frequency of Communication with Friends and Family: Not on file  . Frequency of Social Gatherings with Friends and Family: Not on file  . Attends Religious  Services: Not on file  . Active Member of Clubs or Organizations: Not on file  . Attends Archivist Meetings: Not on file  . Marital Status: Not on file  Intimate Partner Violence:   . Fear of Current or Ex-Partner: Not on file  . Emotionally Abused: Not on file  . Physically Abused: Not on file  . Sexually Abused: Not on file     Current Outpatient Medications:  .  Azelaic Acid (FINACEA) 15 % cream, After skin is thoroughly washed and patted dry, gently but thoroughly massage a thin film of azelaic acid gel into the affected area twice daily, in the morning and evening., Disp: 50 g, Rfl: 12 .  tadalafil (CIALIS) 20 MG tablet, Take 0.5-1 tablets (10-20 mg total) by mouth every other day as needed for erectile dysfunction., Disp: 30 tablet, Rfl: 3 .  valACYclovir (VALTREX) 1000 MG tablet, Take 2 tablets (2,000 mg total) by mouth 2 (two) times daily. For coldsore at onset of symptoms, Disp: 30 tablet, Rfl: 2 .  zolpidem (AMBIEN) 5 MG tablet, Take 1 tablet (5 mg total) by mouth at bedtime as needed for sleep., Disp: 30 tablet, Rfl: 1  EXAM:  VITALS per patient if applicable: Ht 6\' 4"  (1.93 m)   Wt 215 lb (97.5 kg)   BMI 26.17 kg/m   GENERAL: alert, oriented, appears well and in no acute distress  HEENT: atraumatic, conjunttiva clear, no obvious abnormalities on inspection of external nose and ears  NECK: normal movements of the head and neck  LUNGS: on inspection no signs of respiratory distress, breathing rate appears normal, no obvious gross SOB, gasping or wheezing  CV: no obvious cyanosis  MS: moves all visible extremities without noticeable abnormality  PSYCH/NEURO: pleasant and cooperative, no obvious depression or anxiety, speech and thought processing grossly intact  ASSESSMENT AND PLAN:  Discussed the following assessment and plan:  Erectile dysfunction Rx for tadalafil sent to [facility]  Insomnia Discussed use of short term ambien to manage  insomnia.  Start at 5mg  and can titrate if needed based on response.  Discussed side effects of medication, he expresses understanding.  F/u in 1 month.   30 minutes spent including pre visit preparation, review of prior notes and labs, encounter with patient via video visit and same day documentation.    I discussed the assessment and treatment plan with the patient. The patient was provided an opportunity to ask questions and all were answered. The patient agreed with the plan and demonstrated an understanding of the instructions.   The patient was advised to call back  or seek an in-person evaluation if the symptoms worsen or if the condition fails to improve as anticipated.    Luetta Nutting, DO

## 2019-06-21 NOTE — Progress Notes (Signed)
I called the number listed on the chart as preferred and left a VM for patient to call back.

## 2019-07-06 ENCOUNTER — Ambulatory Visit (AMBULATORY_SURGERY_CENTER): Payer: Self-pay | Admitting: *Deleted

## 2019-07-06 ENCOUNTER — Other Ambulatory Visit: Payer: Self-pay

## 2019-07-06 VITALS — Temp 97.6°F | Ht 76.0 in | Wt 198.4 lb

## 2019-07-06 DIAGNOSIS — Z86010 Personal history of colonic polyps: Secondary | ICD-10-CM

## 2019-07-06 DIAGNOSIS — Z01818 Encounter for other preprocedural examination: Secondary | ICD-10-CM

## 2019-07-06 MED ORDER — SUPREP BOWEL PREP KIT 17.5-3.13-1.6 GM/177ML PO SOLN: ORAL | 0 refills | Status: DC

## 2019-07-06 NOTE — Progress Notes (Signed)
covid test 07-14-19 at 2:00 pm  Pt is aware that care partner will wait in the car during procedure; if they feel like they will be too hot or cold to wait in the car; they may wait in the 4 th floor lobby. Patient is aware to bring only one care partner. We want them to wear a mask (we do not have any that we can provide them), practice social distancing, and we will check their temperatures when they get here.  I did remind the patient that their care partner needs to stay in the parking lot the entire time and have a cell phone available, we will call them when the pt is ready for discharge. Patient will wear mask into building.   No egg or soy allergy  No home oxygen use   No medications for weight loss taken  emmi information given  Pt had no trouble with MAC last procedure  Suprep coupon given and code put into RX  Pt states he is going through a separation and may have difficulty finding a care partner.  I told him several times that we cannot do the procedure without a care partner present and understanding voiced.

## 2019-07-18 ENCOUNTER — Encounter: Payer: 59 | Admitting: Internal Medicine

## 2019-07-24 ENCOUNTER — Telehealth (INDEPENDENT_AMBULATORY_CARE_PROVIDER_SITE_OTHER): Payer: 59 | Admitting: Family Medicine

## 2019-07-24 ENCOUNTER — Encounter: Payer: Self-pay | Admitting: Family Medicine

## 2019-07-24 VITALS — BP 120/80 | HR 78 | Wt 188.0 lb

## 2019-07-24 DIAGNOSIS — L989 Disorder of the skin and subcutaneous tissue, unspecified: Secondary | ICD-10-CM

## 2019-07-24 NOTE — Assessment & Plan Note (Signed)
Previous lesions removed by plastic surgeon, referral entered to return to Dr. Kellie Simmering.

## 2019-07-24 NOTE — Progress Notes (Signed)
Darrell Contreras - 59 y.o. male MRN [id number]  Date of birth: [birth year]   This visit type was conducted due to national recommendations for restrictions regarding the COVID-19 Pandemic (e.g. social distancing).  This format is felt to be most appropriate for this patient at this time.  All issues noted in this document were discussed and addressed.  No physical exam was performed (except for noted visual exam findings with Video Visits).  I discussed the limitations of evaluation and management by telemedicine and the availability of in person appointments. The patient expressed understanding and agreed to proceed.  I connected with@ on 07/24/19 at  2:00 PM EDT by a video enabled telemedicine application and verified that I am speaking with the correct person using two identifiers.  Interactive audio and video telecommunications were attempted between this provider and patient, however failed, due to patient having technical difficulties OR patient did not have access to video capability.  We continued and completed visit with audio only.    Present at visit: Luetta Nutting, DO Duanne Limerick   Patient Location: [address] [place] [address]   Provider location:   [facility]  Chief Complaint  Patient presents with  . Follow-up    HPI  Darrell Contreras is a 59 y.o. male who presents via audio/video conferencing for a telehealth visit today.  He reports skin growths on back and shoulders.  Has had similar growths removed by plastic surgeon in the past and requests referral back to Dr. Lance Coon.  Denies pain, swelling or drainage from these areas.    ROS:  A comprehensive ROS was completed and negative except as noted per HPI  Past Medical History:  Diagnosis Date  . Abnormal ECG during exercise stress test 11/09/2016  . Abnormal stress ECG    a. 08/2016: stress test showing 2 mm horizontal ST depression in the inferior and lateral leads with occasional PVC's, overall findings  suggestive of ischemia but possible repol abnormality.  . Prostatitis   . Rosacea   . Seizures ([place])    states had seizure when IV was put in 15 years ago- pt states this isn't accurate    Past Surgical History:  Procedure Laterality Date  . COLONOSCOPY    . NASAL SEPTUM SURGERY    . WISDOM TOOTH EXTRACTION      Family History  Problem Relation Age of Onset  . Cancer Mother 45       breast cancer  . Cancer Father 61       skin cancer  . Aneurysm Father   . Colon cancer Neg Hx   . Esophageal cancer Neg Hx   . Rectal cancer Neg Hx   . Stomach cancer Neg Hx     Social History   Socioeconomic History  . Marital status: Legally Separated    Spouse name: Not on file  . Number of children: 5  . Years of education: Not on file  . Highest education level: Not on file  Occupational History    Comment: Software  Tobacco Use  . Smoking status: Never Smoker  . Smokeless tobacco: Never Used  Substance and Sexual Activity  . Alcohol use: Yes    Alcohol/week: 0.0 standard drinks    Comment: occasional  . Drug use: No  . Sexual activity: Not on file  Other Topics Concern  . Not on file  Social History Narrative  . Not on file   Social Determinants of Health   Financial Resource Strain:   .  Difficulty of Paying Living Expenses:   Food Insecurity:   . Worried About Charity fundraiser in the Last Year:   . Arboriculturist in the Last Year:   Transportation Needs:   . Film/video editor (Medical):   Marland Kitchen Lack of Transportation (Non-Medical):   Physical Activity:   . Days of Exercise per Week:   . Minutes of Exercise per Session:   Stress:   . Feeling of Stress :   Social Connections:   . Frequency of Communication with Friends and Family:   . Frequency of Social Gatherings with Friends and Family:   . Attends Religious Services:   . Active Member of Clubs or Organizations:   . Attends Archivist Meetings:   Marland Kitchen Marital Status:   Intimate Partner Violence:    . Fear of Current or Ex-Partner:   . Emotionally Abused:   Marland Kitchen Physically Abused:   . Sexually Abused:      Current Outpatient Medications:  .  Azelaic Acid (FINACEA) 15 % cream, After skin is thoroughly washed and patted dry, gently but thoroughly massage a thin film of azelaic acid gel into the affected area twice daily, in the morning and evening., Disp: 50 g, Rfl: 12 .  Multiple Vitamins-Minerals (ZINC PO), Take by mouth daily., Disp: , Rfl:  .  Na Sulfate-K Sulfate-Mg Sulf (SUPREP BOWEL PREP KIT) 17.5-3.13-1.6 GM/177ML SOLN, suprep as prescribed, no substitutions, Disp: 354 mL, Rfl: 0 .  tadalafil (CIALIS) 20 MG tablet, Take 0.5-1 tablets (10-20 mg total) by mouth every other day as needed for erectile dysfunction., Disp: 30 tablet, Rfl: 3 .  valACYclovir (VALTREX) 1000 MG tablet, Take 2 tablets (2,000 mg total) by mouth 2 (two) times daily. For coldsore at onset of symptoms, Disp: 30 tablet, Rfl: 2 .  zolpidem (AMBIEN) 5 MG tablet, Take 1 tablet (5 mg total) by mouth at bedtime as needed for sleep., Disp: 30 tablet, Rfl: 1  EXAM:  VITALS per patient if applicable: BP 921/19   Pulse 78   Wt 188 lb (85.3 kg)   BMI 22.88 kg/m   GENERAL: alert,  in no acute distress  PSYCH/NEURO: pleasant and cooperative, admits to depression and anxiety related to separation and pending divorce.  He declines medication but is seeing a therapist that he finds is helpful.  He is optimistic that this will improve.   ASSESSMENT AND PLAN:  Discussed the following assessment and plan:  Skin lesion of back Previous lesions removed by plastic surgeon, referral entered to return to Dr. Kellie Simmering.    20 minutes spent including pre visit preparation, review of prior notes and labs, encounter with patient  and same day documentation.    I discussed the assessment and treatment plan with the patient. The patient was provided an opportunity to ask questions and all were answered. The patient agreed with the  plan and demonstrated an understanding of the instructions.   The patient was advised to call back or seek an in-person evaluation if the symptoms worsen or if the condition fails to improve as anticipated.    Luetta Nutting, DO

## 2019-07-24 NOTE — Progress Notes (Signed)
Called patient at 1:56pm to go over chart and update. Left message.   Patient reports he would like some moles removed   [facility] in [place]  Mostly in the back and he reports that he may need a referral for the group in [place]  Has had both covid vaccine and updated in chart. Some pain and swelling in arm and increase in heart rate.

## 2019-08-01 ENCOUNTER — Encounter: Payer: 59 | Admitting: Internal Medicine

## 2019-08-10 ENCOUNTER — Encounter: Payer: Self-pay | Admitting: Internal Medicine

## 2019-08-15 ENCOUNTER — Ambulatory Visit (AMBULATORY_SURGERY_CENTER): Payer: 59 | Admitting: Internal Medicine

## 2019-08-15 ENCOUNTER — Other Ambulatory Visit: Payer: Self-pay

## 2019-08-15 ENCOUNTER — Encounter: Payer: Self-pay | Admitting: Internal Medicine

## 2019-08-15 VITALS — BP 87/63 | HR 64 | Temp 97.1°F | Resp 14 | Ht 76.0 in | Wt 198.4 lb

## 2019-08-15 DIAGNOSIS — Z86010 Personal history of colonic polyps: Secondary | ICD-10-CM

## 2019-08-15 MED ORDER — SODIUM CHLORIDE 0.9 % IV SOLN: 500.0000 mL | Freq: Once | INTRAVENOUS | Status: DC

## 2019-08-15 NOTE — Op Note (Signed)
[facility] Patient Name: Darrell Contreras Procedure Date: 08/15/2019 1:29 PM MRN: [id number] Endoscopist: Jerene Bears , MD Age: 59 Referring MD:  Date of Birth: May 15, 1960 Gender: Male Account #: [id number] Procedure:                Colonoscopy Indications:              High risk colon cancer surveillance: Personal                            history of adenoma (10 mm or greater in size), Last                            colonoscopy: December 2017 Medicines:                Monitored Anesthesia Care Procedure:                Pre-Anesthesia Assessment:                           - Prior to the procedure, a History and Physical                            was performed, and patient medications and                            allergies were reviewed. The patient's tolerance of                            previous anesthesia was also reviewed. The risks                            and benefits of the procedure and the sedation                            options and risks were discussed with the patient.                            All questions were answered, and informed consent                            was obtained. Prior Anticoagulants: The patient has                            taken no previous anticoagulant or antiplatelet                            agents. ASA Grade Assessment: II - A patient with                            mild systemic disease. After reviewing the risks                            and benefits, the patient was deemed in  satisfactory condition to undergo the procedure.                           After obtaining informed consent, the colonoscope                            was passed under direct vision. Throughout the                            procedure, the patient's blood pressure, pulse, and                            oxygen saturations were monitored continuously. The                            Colonoscope was introduced through the anus  and                            advanced to the terminal ileum. The colonoscopy was                            performed without difficulty. The patient tolerated                            the procedure well. The quality of the bowel                            preparation was good. The ileocecal valve,                            appendiceal orifice, and rectum were photographed. Scope In: 1:36:18 PM Scope Out: 1:55:54 PM Scope Withdrawal Time: 0 hours 13 minutes 37 seconds  Total Procedure Duration: 0 hours 19 minutes 36 seconds  Findings:                 The digital rectal exam was normal.                           The terminal ileum appeared normal.                           A few small-mouthed diverticula were found in the                            distal sigmoid colon.                           The exam was otherwise without abnormality on                            direct and retroflexion views. Complications:            No immediate complications. Estimated Blood Loss:     Estimated blood loss: none. Impression:               - The examined portion of the  ileum was normal.                           - Minimal diverticulosis in the distal sigmoid                            colon.                           - The examination was otherwise normal on direct                            and retroflexion views.                           - No specimens collected. Recommendation:           - Patient has a contact number available for                            emergencies. The signs and symptoms of potential                            delayed complications were discussed with the                            patient. Return to normal activities tomorrow.                            Written discharge instructions were provided to the                            patient.                           - Resume previous diet.                           - Continue present medications.                            - Repeat colonoscopy in 5 years for surveillance. Jerene Bears, MD 08/15/2019 2:00:42 PM This report has been signed electronically.

## 2019-08-15 NOTE — Patient Instructions (Signed)
YOU HAD AN ENDOSCOPIC PROCEDURE TODAY AT THE [facility]:   Refer to the procedure report that was given to you for any specific questions about what was found during the examination.  If the procedure report does not answer your questions, please call your gastroenterologist to clarify.  If you requested that your care partner not be given the details of your procedure findings, then the procedure report has been included in a sealed envelope for you to review at your convenience later.  YOU SHOULD EXPECT: Some feelings of bloating in the abdomen. Passage of more gas than usual.  Walking can help get rid of the air that was put into your GI tract during the procedure and reduce the bloating. If you had a lower endoscopy (such as a colonoscopy or flexible sigmoidoscopy) you may notice spotting of blood in your stool or on the toilet paper. If you underwent a bowel prep for your procedure, you may not have a normal bowel movement for a few days.  Please Note:  You might notice some irritation and congestion in your nose or some drainage.  This is from the oxygen used during your procedure.  There is no need for concern and it should clear up in a day or so.  SYMPTOMS TO REPORT IMMEDIATELY:   Following lower endoscopy (colonoscopy or flexible sigmoidoscopy):  Excessive amounts of blood in the stool  Significant tenderness or worsening of abdominal pains  Swelling of the abdomen that is new, acute  Fever of 100F or higher  For urgent or emergent issues, a gastroenterologist can be reached at any hour by calling [phone]. Do not use MyChart messaging for urgent concerns.    DIET:  We do recommend a small meal at first, but then you may proceed to your regular diet.  Drink plenty of fluids but you should avoid alcoholic beverages for 24 hours.  ACTIVITY:  You should plan to take it easy for the rest of today and you should NOT DRIVE or use heavy machinery until tomorrow (because  of the sedation medicines used during the test).    FOLLOW UP: Our staff will call the number listed on your records 48-72 hours following your procedure to check on you and address any questions or concerns that you may have regarding the information given to you following your procedure. If we do not reach you, we will leave a message.  We will attempt to reach you two times.  During this call, we will ask if you have developed any symptoms of COVID 19. If you develop any symptoms (ie: fever, flu-like symptoms, shortness of breath, cough etc.) before then, please call [phone].  If you test positive for Covid 19 in the 2 weeks post procedure, please call and report this information to us.    If any biopsies were taken you will be contacted by phone or by letter within the next 1-3 weeks.  Please call us at [phone] if you have not heard about the biopsies in 3 weeks.    SIGNATURES/CONFIDENTIALITY: You and/or your care partner have signed paperwork which will be entered into your electronic medical record.  These signatures attest to the fact that that the information above on your After Visit Summary has been reviewed and is understood.  Full responsibility of the confidentiality of this discharge information lies with you and/or your care-partner. 

## 2019-08-15 NOTE — Progress Notes (Signed)
Report given to PACU, vss 

## 2019-08-15 NOTE — Progress Notes (Signed)
Temp by JB Vitals by DT  Pt's states no medical or surgical changes since previsit or office visit. 

## 2019-08-17 ENCOUNTER — Telehealth: Payer: Self-pay

## 2019-08-17 NOTE — Telephone Encounter (Signed)
  Follow up Call-  Call back number 08/15/2019  Post procedure Call Back phone  # [phone]  Permission to leave phone message Yes  Some recent data might be hidden     Patient questions:  Do you have a fever, pain , or abdominal swelling? No. Pain Score  0 *  Have you tolerated food without any problems? Yes.    Have you been able to return to your normal activities? Yes.    Do you have any questions about your discharge instructions: Diet   No. Medications  No. Follow up visit  No.  Do you have questions or concerns about your Care? No.  Actions: * If pain score is 4 or above: No action needed, pain <4.   1. Have you developed a fever since your procedure? No  2.   Have you had an respiratory symptoms (SOB or cough) since your procedure? No  3.   Have you tested positive for COVID 19 since your procedure No  4.   Have you had any family members/close contacts diagnosed with the COVID 19 since your procedure?  No   If yes to any of these questions please route to Joylene John, RN and Erenest Rasher, RN

## 2019-10-05 ENCOUNTER — Other Ambulatory Visit: Payer: Self-pay

## 2019-10-05 DIAGNOSIS — F5102 Adjustment insomnia: Secondary | ICD-10-CM

## 2019-10-06 MED ORDER — ZOLPIDEM TARTRATE 5 MG PO TABS: 5.0000 mg | ORAL_TABLET | Freq: Every evening | ORAL | 1 refills | Status: DC | PRN

## 2019-10-13 IMAGING — CT CT ABDOMEN AND PELVIS WITH CONTRAST
2 of 5 series · 16 of 46 positions shown, 18 images · IV contrast (OMNIPAQUE 300)
Comparison: None.

CLINICAL DATA: Right lower quadrant and periumbilical pain.

EXAM:
CT ABDOMEN AND PELVIS WITH CONTRAST
TECHNIQUE: Multidetector CT imaging of the abdomen and pelvis was performed
using the standard protocol following bolus administration of
intravenous contrast.
CONTRAST:  100mL OMNIPAQUE IOHEXOL 300 MG/ML  SOLN

[Series 2: abd/pel w · axial · 0.80mm/px · z∈[-513,-138]mm · 13 of 85 slices shown, 15 images]
[im 5/85  soft-tissue]
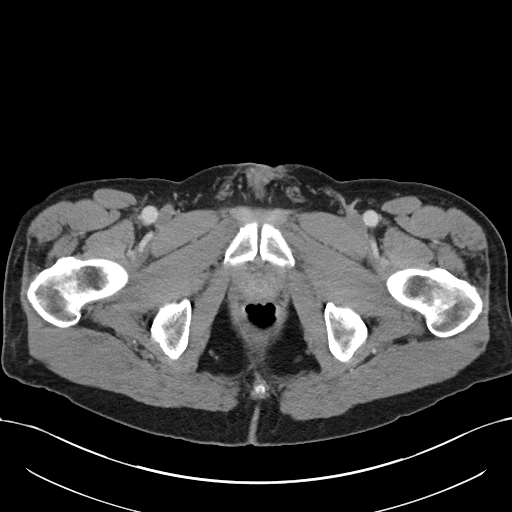
[im 5/85  bone]
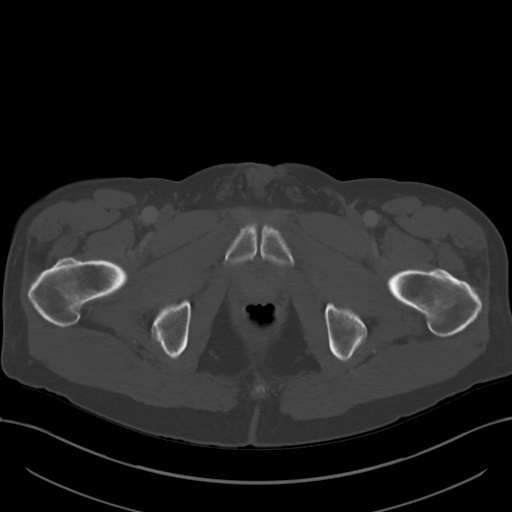
[im 10/85  soft-tissue]
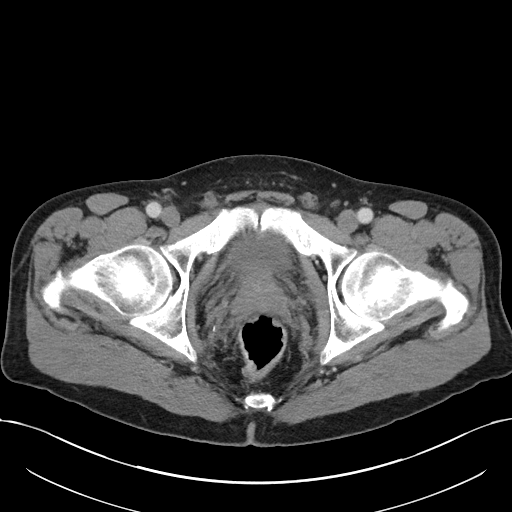
[im 19/85  soft-tissue]
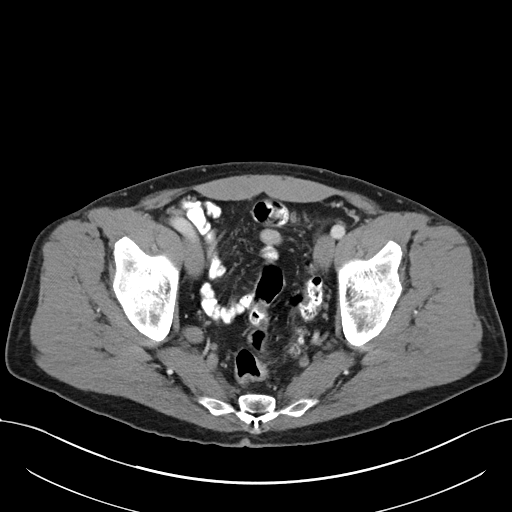
[im 24/85  soft-tissue]
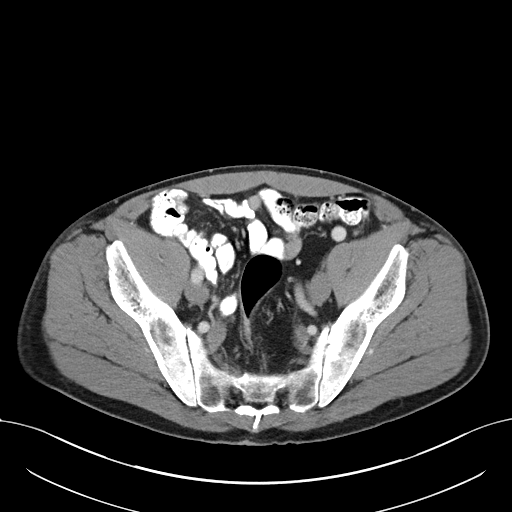
[im 29/85  soft-tissue]
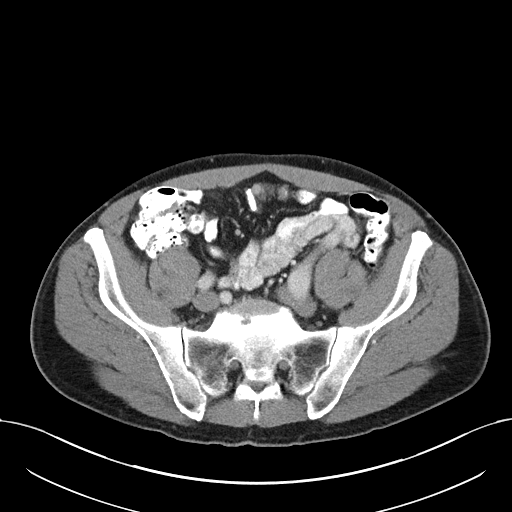
[im 38/85  soft-tissue]
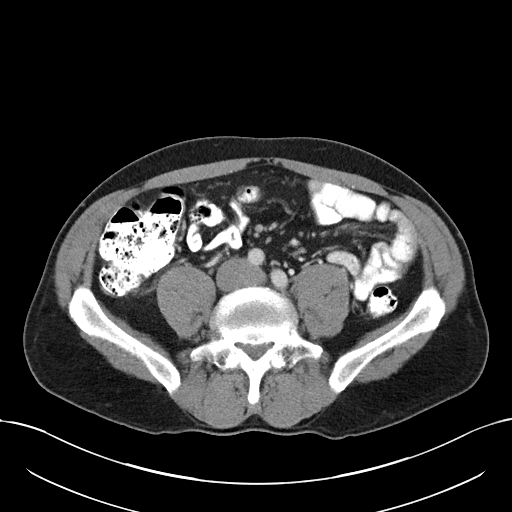
[im 43/85  soft-tissue]
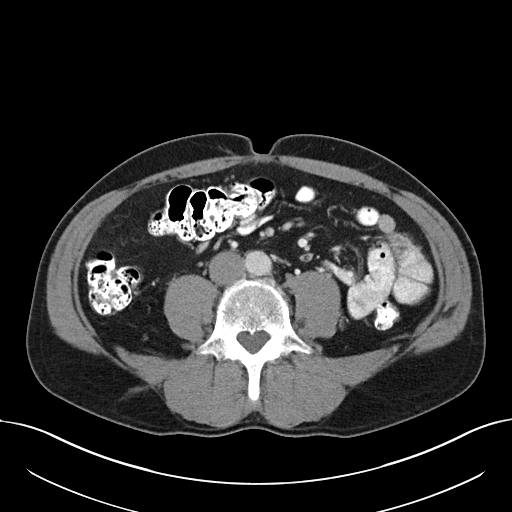
[im 47/85  soft-tissue]
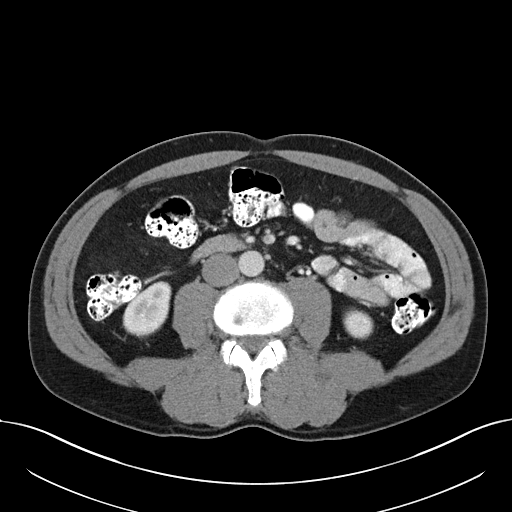
[im 57/85  soft-tissue]
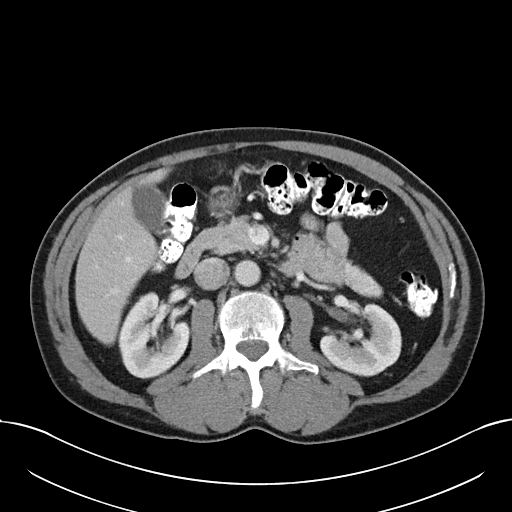
[im 57/85  bone]
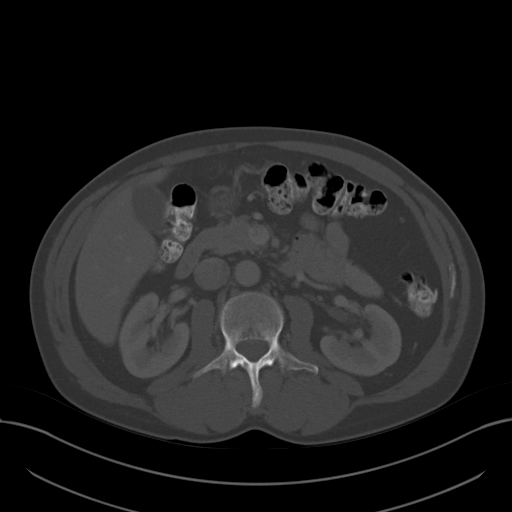
[im 61/85  soft-tissue]
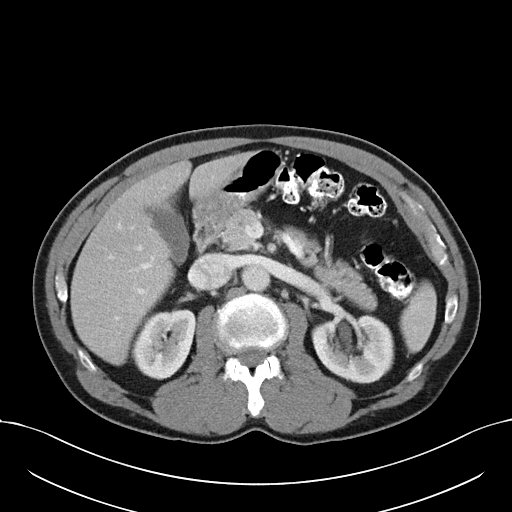
[im 66/85  soft-tissue]
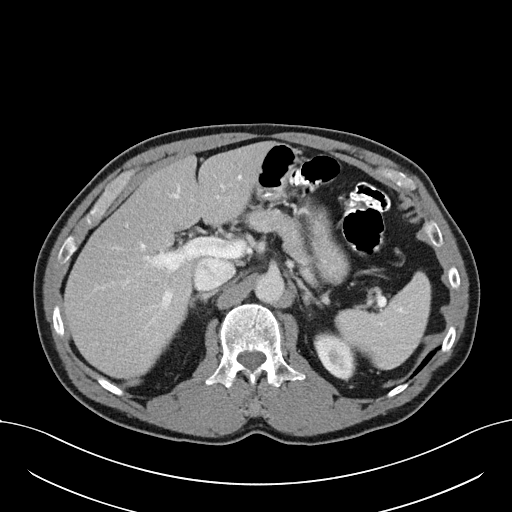
[im 75/85  soft-tissue]
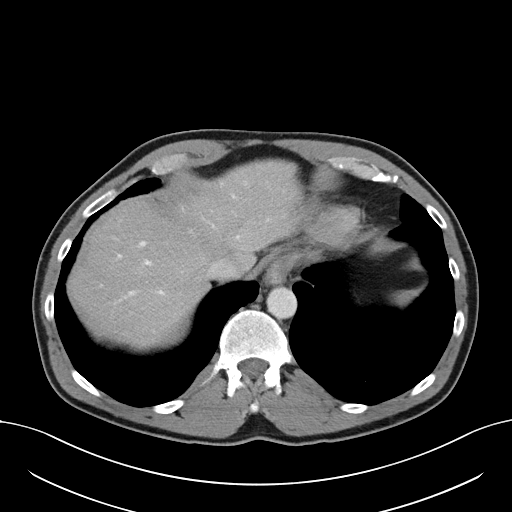
[im 80/85  soft-tissue]
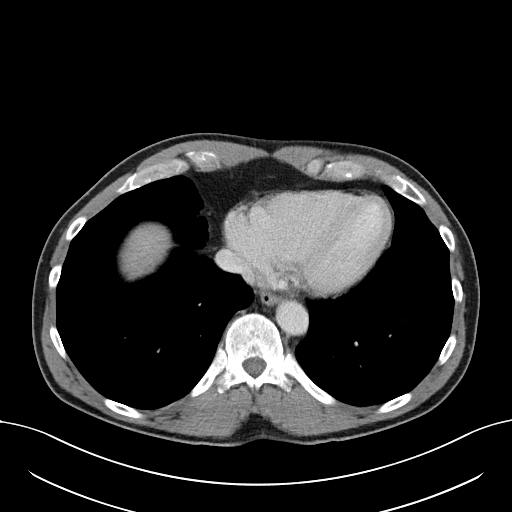

[Series 6: abd/pel w st · coronal · 0.71mm/px · 3 of 92 slices shown]
[im 31/92  soft-tissue]
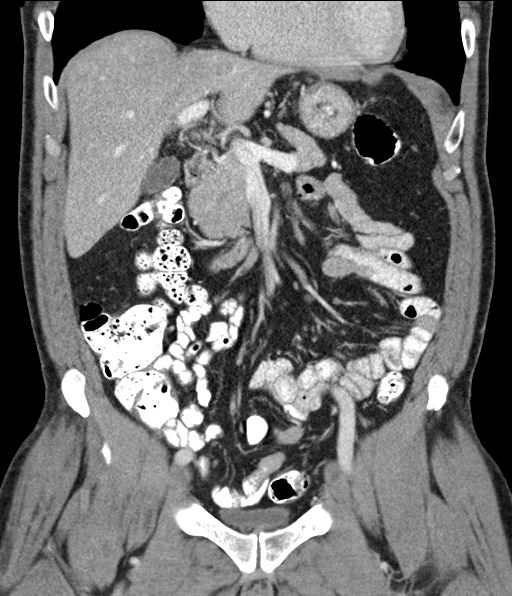
[im 41/92  soft-tissue]
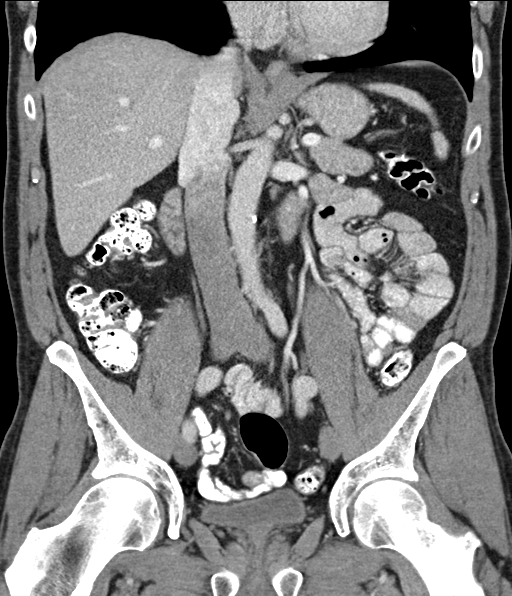
[im 51/92  soft-tissue]
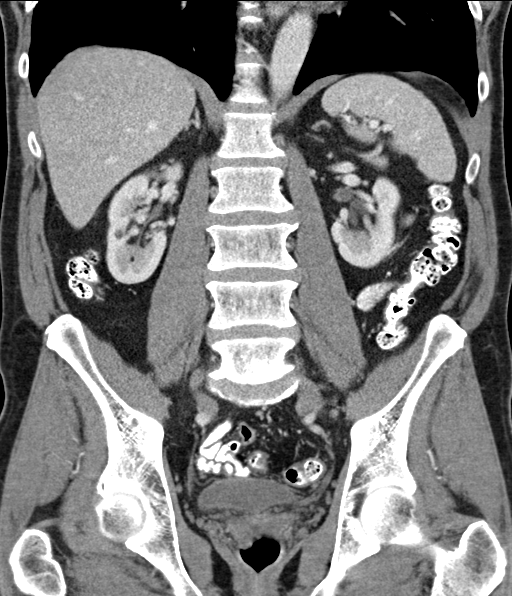

[16 of 46 positions shown; findings below may reference images not displayed]

FINDINGS: Lower chest: 4 mm nodule identified left lower lobe on [DATE]. Other
additional 3-4 mm pulmonary nodules are seen in the lung bases
bilaterally.

Hepatobiliary: No suspicious focal abnormality within the liver
parenchyma. There is no evidence for gallstones, gallbladder wall
thickening, or pericholecystic fluid. No intrahepatic or
extrahepatic biliary dilation.

Pancreas: No focal mass lesion. No dilatation of the main duct. No
intraparenchymal cyst. No peripancreatic edema.

Spleen: No splenomegaly. No focal mass lesion.

Adrenals/Urinary Tract: No adrenal nodule or mass. 5 mm low-density
lesion lower pole right kidney is too small to characterize. Central
sinus cysts noted left kidney. No evidence for hydroureter. The
urinary bladder appears normal for the degree of distention.

Stomach/Bowel: Stomach is unremarkable. No gastric wall thickening.
No evidence of outlet obstruction. Duodenum is normally positioned
as is the ligament of Treitz. No small bowel wall thickening. No
small bowel dilatation. The terminal ileum is normal. The appendix
is normal. No gross colonic mass. No colonic wall thickening.

Vascular/Lymphatic: There is abdominal aortic atherosclerosis
without aneurysm. There is no gastrohepatic or hepatoduodenal
ligament lymphadenopathy. No intraperitoneal or retroperitoneal
lymphadenopathy. No pelvic sidewall lymphadenopathy.

Reproductive: The prostate gland and seminal vesicles are
unremarkable.

Other: No intraperitoneal free fluid.

Musculoskeletal: No worrisome lytic or sclerotic osseous
abnormality.
IMPRESSION: 1. Tiny bilateral pulmonary nodules measuring up to 4 mm. No
follow-up needed if patient is low-risk (and has no known or
suspected primary neoplasm). Non-contrast chest CT can be considered
in 12 months if patient is high-risk. This recommendation follows
the consensus statement: Guidelines for Management of Incidental
Pulmonary Nodules Detected on CT Images: From the [HOSPITAL]
2. No acute findings in the abdomen or pelvis. No findings to
explain the patient's history of pain.

## 2020-01-11 ENCOUNTER — Encounter: Payer: Self-pay | Admitting: Family Medicine

## 2020-01-29 ENCOUNTER — Encounter: Payer: Self-pay | Admitting: Family Medicine

## 2020-02-01 ENCOUNTER — Ambulatory Visit (INDEPENDENT_AMBULATORY_CARE_PROVIDER_SITE_OTHER): Payer: 59 | Admitting: Family Medicine

## 2020-02-01 ENCOUNTER — Encounter: Payer: Self-pay | Admitting: Family Medicine

## 2020-02-01 DIAGNOSIS — F5102 Adjustment insomnia: Secondary | ICD-10-CM | POA: Diagnosis not present

## 2020-02-01 MED ORDER — CLONAZEPAM 1 MG PO TABS
1.0000 mg | ORAL_TABLET | Freq: Every day | ORAL | 4 refills | Status: DC
Start: 2020-02-01 — End: 2020-09-02

## 2020-02-01 NOTE — Progress Notes (Signed)
Darrell Contreras - 59 y.o. male MRN [id number]  Date of birth: 20-Apr-1960  Subjective Chief Complaint  Patient presents with  . Anxiety    HPI Darrell Contreras is a 59 y.o. male here today to discuss anxiety and sleep difficulties.  He is in the middle of a difficult divorce and has had difficulty sleeping due anxiety and panic.  He has trouble falling asleep and will wake up at night sweating and heart racing.  He was previously taking zolpidem but this was not working very well.  His urologist did provide him with a prescription for clonazepam and he has found that this is working pretty well for his sleep.  He expects this to only be temporary until his divorce is finalized.    ROS:  A comprehensive ROS was completed and negative except as noted per HPI  No Known Allergies  Past Medical History:  Diagnosis Date  . Abnormal ECG during exercise stress test 11/09/2016  . Abnormal stress ECG    a. 08/2016: stress test showing 2 mm horizontal ST depression in the inferior and lateral leads with occasional PVC's, overall findings suggestive of ischemia but possible repol abnormality.  . Prostatitis   . Rosacea   . Seizures ([place])    states had seizure when IV was put in 15 years ago- pt states this isn't accurate    Past Surgical History:  Procedure Laterality Date  . COLONOSCOPY    . NASAL SEPTUM SURGERY    . WISDOM TOOTH EXTRACTION      Social History   Socioeconomic History  . Marital status: Legally Separated    Spouse name: Not on file  . Number of children: 5  . Years of education: Not on file  . Highest education level: Not on file  Occupational History    Comment: Software  Tobacco Use  . Smoking status: Never Smoker  . Smokeless tobacco: Never Used  Vaping Use  . Vaping Use: Never used  Substance and Sexual Activity  . Alcohol use: Yes    Alcohol/week: 0.0 standard drinks    Comment: occasional  . Drug use: No  . Sexual activity: Not on file  Other Topics Concern  . Not  on file  Social History Narrative  . Not on file   Social Determinants of Health   Financial Resource Strain:   . Difficulty of Paying Living Expenses: Not on file  Food Insecurity:   . Worried About Charity fundraiser in the Last Year: Not on file  . Ran Out of Food in the Last Year: Not on file  Transportation Needs:   . Lack of Transportation (Medical): Not on file  . Lack of Transportation (Non-Medical): Not on file  Physical Activity:   . Days of Exercise per Week: Not on file  . Minutes of Exercise per Session: Not on file  Stress:   . Feeling of Stress : Not on file  Social Connections:   . Frequency of Communication with Friends and Family: Not on file  . Frequency of Social Gatherings with Friends and Family: Not on file  . Attends Religious Services: Not on file  . Active Member of Clubs or Organizations: Not on file  . Attends Archivist Meetings: Not on file  . Marital Status: Not on file    Family History  Problem Relation Age of Onset  . Cancer Mother 29       breast cancer  . Cancer Father 9  skin cancer  . Aneurysm Father   . Colon cancer Neg Hx   . Esophageal cancer Neg Hx   . Rectal cancer Neg Hx   . Stomach cancer Neg Hx     Health Maintenance  Topic Date Due  . INFLUENZA VACCINE  11/12/2019  . Hepatitis C Screening  12/12/2028 (Originally 1960/11/06)  . COLONOSCOPY  08/14/2024  . TETANUS/TDAP  01/18/2029  . COVID-19 Vaccine  Completed  . HIV Screening  Completed     ----------------------------------------------------------------------------------------------------------------------------------------------------------------------------------------------------------------- Physical Exam BP 119/66 (BP Location: Left Arm, Patient Position: Sitting, Cuff Size: Normal)   Pulse 63   Temp 98.3 F (36.8 C)   Ht 6\' 4"  (1.93 m)   Wt 189 lb 3.2 oz (85.8 kg)   SpO2 100%   BMI 23.03 kg/m   Physical Exam Constitutional:       Appearance: Normal appearance.  Lymphadenopathy:     Cervical: No cervical adenopathy.  Neurological:     General: No focal deficit present.     Mental Status: He is alert.  Psychiatric:        Mood and Affect: Mood normal.        Behavior: Behavior normal.     ------------------------------------------------------------------------------------------------------------------------------------------------------------------------------------------------------------------- Assessment and Plan  Insomnia Insomnia worsened due to increased stress related to divorce.  Will continue clonazepam for now, understands this will be temporary until divorce is finalized.     Meds ordered this encounter  Medications  . clonazePAM (KLONOPIN) 1 MG tablet    Sig: Take 1 tablet (1 mg total) by mouth at bedtime.    Dispense:  30 tablet    Refill:  4    No follow-ups on file.    This visit occurred during the SARS-CoV-2 public health emergency.  Safety protocols were in place, including screening questions prior to the visit, additional usage of staff PPE, and extensive cleaning of exam room while observing appropriate contact time as indicated for disinfecting solutions.

## 2020-02-01 NOTE — Assessment & Plan Note (Signed)
Insomnia worsened due to increased stress related to divorce.  Will continue clonazepam for now, understands this will be temporary until divorce is finalized.

## 2020-03-21 ENCOUNTER — Other Ambulatory Visit: Payer: Self-pay

## 2020-03-21 ENCOUNTER — Ambulatory Visit (INDEPENDENT_AMBULATORY_CARE_PROVIDER_SITE_OTHER): Payer: 59 | Admitting: Family Medicine

## 2020-03-21 ENCOUNTER — Encounter: Payer: Self-pay | Admitting: Family Medicine

## 2020-03-21 VITALS — BP 124/78 | HR 63 | Temp 98.0°F | Ht 75.0 in | Wt 194.5 lb

## 2020-03-21 DIAGNOSIS — E782 Mixed hyperlipidemia: Secondary | ICD-10-CM

## 2020-03-21 DIAGNOSIS — Z0000 Encounter for general adult medical examination without abnormal findings: Secondary | ICD-10-CM | POA: Diagnosis not present

## 2020-03-21 NOTE — Assessment & Plan Note (Signed)
Well adult Orders Placed This Encounter  Procedures  . COMPLETE METABOLIC PANEL WITH GFR  . CBC  . Lipid Profile  Screening: UTD Immunizations: UTD Anticipatory guidance/Risk factor Reduction:  Recommendations per AVS.

## 2020-03-21 NOTE — Patient Instructions (Signed)
Preventive Care 41-59 Years Old, Male Preventive care refers to lifestyle choices and visits with your health care provider that can promote health and wellness. This includes:  A yearly physical exam. This is also called an annual well check.  Regular dental and eye exams.  Immunizations.  Screening for certain conditions.  Healthy lifestyle choices, such as eating a healthy diet, getting regular exercise, not using drugs or products that contain nicotine and tobacco, and limiting alcohol use. What can I expect for my preventive care visit? Physical exam Your health care provider will check:  Height and weight. These may be used to calculate body mass index (BMI), which is a measurement that tells if you are at a healthy weight.  Heart rate and blood pressure.  Your skin for abnormal spots. Counseling Your health care provider may ask you questions about:  Alcohol, tobacco, and drug use.  Emotional well-being.  Home and relationship well-being.  Sexual activity.  Eating habits.  Work and work Statistician. What immunizations do I need?  Influenza (flu) vaccine  This is recommended every year. Tetanus, diphtheria, and pertussis (Tdap) vaccine  You may need a Td booster every 10 years. Varicella (chickenpox) vaccine  You may need this vaccine if you have not already been vaccinated. Zoster (shingles) vaccine  You may need this after age 64. Measles, mumps, and rubella (MMR) vaccine  You may need at least one dose of MMR if you were born in 1957 or later. You may also need a second dose. Pneumococcal conjugate (PCV13) vaccine  You may need this if you have certain conditions and were not previously vaccinated. Pneumococcal polysaccharide (PPSV23) vaccine  You may need one or two doses if you smoke cigarettes or if you have certain conditions. Meningococcal conjugate (MenACWY) vaccine  You may need this if you have certain conditions. Hepatitis A  vaccine  You may need this if you have certain conditions or if you travel or work in places where you may be exposed to hepatitis A. Hepatitis B vaccine  You may need this if you have certain conditions or if you travel or work in places where you may be exposed to hepatitis B. Haemophilus influenzae type b (Hib) vaccine  You may need this if you have certain risk factors. Human papillomavirus (HPV) vaccine  If recommended by your health care provider, you may need three doses over 6 months. You may receive vaccines as individual doses or as more than one vaccine together in one shot (combination vaccines). Talk with your health care provider about the risks and benefits of combination vaccines. What tests do I need? Blood tests  Lipid and cholesterol levels. These may be checked every 5 years, or more frequently if you are over 60 years old.  Hepatitis C test.  Hepatitis B test. Screening  Lung cancer screening. You may have this screening every year starting at age 43 if you have a 30-pack-year history of smoking and currently smoke or have quit within the past 15 years.  Prostate cancer screening. Recommendations will vary depending on your family history and other risks.  Colorectal cancer screening. All adults should have this screening starting at age 72 and continuing until age 2. Your health care provider may recommend screening at age 14 if you are at increased risk. You will have tests every 1-10 years, depending on your results and the type of screening test.  Diabetes screening. This is done by checking your blood sugar (glucose) after you have not eaten  for a while (fasting). You may have this done every 1-3 years.  Sexually transmitted disease (STD) testing. Follow these instructions at home: Eating and drinking  Eat a diet that includes fresh fruits and vegetables, whole grains, lean protein, and low-fat dairy products.  Take vitamin and mineral supplements as  recommended by your health care provider.  Do not drink alcohol if your health care provider tells you not to drink.  If you drink alcohol: ? Limit how much you have to 0-2 drinks a day. ? Be aware of how much alcohol is in your drink. In the [place] one drink equals one 12 oz bottle of beer (355 mL), one 5 oz glass of wine (148 mL), or one 1 oz glass of hard liquor (44 mL). Lifestyle  Take daily care of your teeth and gums.  Stay active. Exercise for at least 30 minutes on 5 or more days each week.  Do not use any products that contain nicotine or tobacco, such as cigarettes, e-cigarettes, and chewing tobacco. If you need help quitting, ask your health care provider.  If you are sexually active, practice safe sex. Use a condom or other form of protection to prevent STIs (sexually transmitted infections).  Talk with your health care provider about taking a low-dose aspirin every day starting at age 53. What's next?  Go to your health care provider once a year for a well check visit.  Ask your health care provider how often you should have your eyes and teeth checked.  Stay up to date on all vaccines. This information is not intended to replace advice given to you by your health care provider. Make sure you discuss any questions you have with your health care provider. Document Revised: 03/24/2018 Document Reviewed: 03/24/2018 Elsevier Patient Education  2020 [employer].

## 2020-03-21 NOTE — Progress Notes (Signed)
Darrell Contreras - 59 y.o. male MRN [id number]  Date of birth: [birth year]  Subjective Chief Complaint  Patient presents with  . Annual Exam    HPI Darrell Contreras is a 59 y.o. male here today for annual exam.  He denies significant changes to his health since his last visit.  He continues to have some difficulty with sleep related to anxiety about his divorce and custody of his children.    He stays pretty active and runs for exercise.  He feels like his diet is pretty healthy.    He is a non-smoker.  He consumes EtOH occasionally.    He has had COVID, Shingles and flu vaccine.   He is up to date on colon cancer screening.   Review of Systems  Constitutional: Negative for chills, fever, malaise/fatigue and weight loss.  HENT: Negative for congestion, ear pain and sore throat.   Eyes: Negative for blurred vision, double vision and pain.  Respiratory: Negative for cough and shortness of breath.   Cardiovascular: Negative for chest pain and palpitations.  Gastrointestinal: Negative for abdominal pain, blood in stool, constipation, heartburn and nausea.  Genitourinary: Negative for dysuria and urgency.  Musculoskeletal: Negative for joint pain and myalgias.  Neurological: Negative for dizziness and headaches.  Endo/Heme/Allergies: Does not bruise/bleed easily.  Psychiatric/Behavioral: Negative for depression. The patient is not nervous/anxious and does not have insomnia.      No Known Allergies  Past Medical History:  Diagnosis Date  . Abnormal ECG during exercise stress test 11/09/2016  . Abnormal stress ECG    a. 08/2016: stress test showing 2 mm horizontal ST depression in the inferior and lateral leads with occasional PVC's, overall findings suggestive of ischemia but possible repol abnormality.  . Prostatitis   . Rosacea   . Seizures ([place])    states had seizure when IV was put in 15 years ago- pt states this isn't accurate    Past Surgical History:  Procedure Laterality Date  .  COLONOSCOPY    . NASAL SEPTUM SURGERY    . WISDOM TOOTH EXTRACTION      Social History   Socioeconomic History  . Marital status: Legally Separated    Spouse name: Not on file  . Number of children: 5  . Years of education: Not on file  . Highest education level: Not on file  Occupational History    Comment: Software  Tobacco Use  . Smoking status: Never Smoker  . Smokeless tobacco: Never Used  Vaping Use  . Vaping Use: Never used  Substance and Sexual Activity  . Alcohol use: Yes    Alcohol/week: 0.0 standard drinks    Comment: occasional  . Drug use: No  . Sexual activity: Not on file  Other Topics Concern  . Not on file  Social History Narrative  . Not on file   Social Determinants of Health   Financial Resource Strain: Not on file  Food Insecurity: Not on file  Transportation Needs: Not on file  Physical Activity: Not on file  Stress: Not on file  Social Connections: Not on file    Family History  Problem Relation Age of Onset  . Cancer Mother 25       breast cancer  . Cancer Father 80       skin cancer  . Aneurysm Father   . Colon cancer Neg Hx   . Esophageal cancer Neg Hx   . Rectal cancer Neg Hx   . Stomach cancer Neg Hx  Health Maintenance  Topic Date Due  . Hepatitis C Screening  12/12/2028 (Originally 1960-11-22)  . COLONOSCOPY  08/14/2024  . TETANUS/TDAP  01/18/2029  . INFLUENZA VACCINE  Completed  . COVID-19 Vaccine  Completed  . HIV Screening  Completed     ----------------------------------------------------------------------------------------------------------------------------------------------------------------------------------------------------------------- Physical Exam BP 124/78 (BP Location: Left Arm, Patient Position: Sitting, Cuff Size: Normal)   Pulse 63   Temp 98 F (36.7 C)   Ht 6\' 3"  (1.905 m)   Wt 194 lb 8 oz (88.2 kg)   SpO2 99%   BMI 24.31 kg/m   Physical Exam Constitutional:      General: He is not in  acute distress.    Appearance: He is well-nourished.  HENT:     Head: Normocephalic and atraumatic.     Right Ear: External ear normal.     Left Ear: External ear normal.     Mouth/Throat:     Mouth: Oropharynx is clear and moist. Mucous membranes are moist.  Eyes:     General: No scleral icterus. Neck:     Thyroid: No thyromegaly.  Cardiovascular:     Rate and Rhythm: Normal rate and regular rhythm.     Pulses: Intact distal pulses.     Heart sounds: Normal heart sounds.  Pulmonary:     Effort: Pulmonary effort is normal.     Breath sounds: Normal breath sounds.  Abdominal:     General: Bowel sounds are normal. There is no distension.     Palpations: Abdomen is soft.     Tenderness: There is no abdominal tenderness. There is no guarding.  Musculoskeletal:        General: No edema.     Cervical back: Normal range of motion.  Lymphadenopathy:     Cervical: No cervical adenopathy.  Skin:    General: Skin is warm and dry.     Findings: No rash.  Neurological:     General: No focal deficit present.     Mental Status: He is alert and oriented to person, place, and time.     Cranial Nerves: No cranial nerve deficit.     Motor: No abnormal muscle tone.  Psychiatric:        Mood and Affect: Mood and affect and mood normal.        Behavior: Behavior normal.     ------------------------------------------------------------------------------------------------------------------------------------------------------------------------------------------------------------------- Assessment and Plan  Well adult exam Well adult Orders Placed This Encounter  Procedures  . COMPLETE METABOLIC PANEL WITH GFR  . CBC  . Lipid Profile  Screening: UTD Immunizations: UTD Anticipatory guidance/Risk factor Reduction:  Recommendations per AVS.    No orders of the defined types were placed in this encounter.   No follow-ups on file.    This visit occurred during the SARS-CoV-2 public  health emergency.  Safety protocols were in place, including screening questions prior to the visit, additional usage of staff PPE, and extensive cleaning of exam room while observing appropriate contact time as indicated for disinfecting solutions.

## 2020-03-22 LAB — CBC
HCT: 41.9 % (ref 38.5–50.0)
Hemoglobin: 14.4 g/dL (ref 13.2–17.1)
MCH: 31.5 pg (ref 27.0–33.0)
MCHC: 34.4 g/dL (ref 32.0–36.0)
MCV: 91.7 fL (ref 80.0–100.0)
MPV: 10.9 fL (ref 7.5–12.5)
Platelets: 219 10*3/uL (ref 140–400)
RBC: 4.57 10*6/uL (ref 4.20–5.80)
RDW: 12.2 % (ref 11.0–15.0)
WBC: 5.1 10*3/uL (ref 3.8–10.8)

## 2020-03-22 LAB — LIPID PANEL
Cholesterol: 147 mg/dL (ref <200)
HDL: 53 mg/dL (ref >=40)
LDL Cholesterol (Calc): 80 mg/dL (calc)
Non-HDL Cholesterol (Calc): 94 mg/dL (calc) (ref <130)
Total CHOL/HDL Ratio: 2.8 (calc) (ref <5.0)
Triglycerides: 64 mg/dL (ref <150)

## 2020-03-22 LAB — COMPLETE METABOLIC PANEL WITH GFR
AG Ratio: 1.8 (calc) (ref 1.0–2.5)
ALT: 28 U/L (ref 9–46)
AST: 30 U/L (ref 10–35)
Albumin: 4.4 g/dL (ref 3.6–5.1)
Alkaline phosphatase (APISO): 63 U/L (ref 35–144)
BUN: 17 mg/dL (ref 7–25)
CO2: 28 mmol/L (ref 20–32)
Calcium: 9.9 mg/dL (ref 8.6–10.3)
Chloride: 105 mmol/L (ref 98–110)
Creat: 0.96 mg/dL (ref 0.70–1.33)
GFR, Est African American: 100 mL/min/{1.73_m2} (ref >=60)
GFR, Est Non African American: 86 mL/min/{1.73_m2} (ref >=60)
Globulin: 2.4 g/dL (calc) (ref 1.9–3.7)
Glucose, Bld: 99 mg/dL (ref 65–99)
Potassium: 4.4 mmol/L (ref 3.5–5.3)
Sodium: 140 mmol/L (ref 135–146)
Total Bilirubin: 0.6 mg/dL (ref 0.2–1.2)
Total Protein: 6.8 g/dL (ref 6.1–8.1)

## 2020-05-25 ENCOUNTER — Other Ambulatory Visit: Payer: Self-pay | Admitting: Family Medicine

## 2020-05-27 ENCOUNTER — Ambulatory Visit (INDEPENDENT_AMBULATORY_CARE_PROVIDER_SITE_OTHER): Payer: 59 | Admitting: Family Medicine

## 2020-05-27 ENCOUNTER — Ambulatory Visit: Payer: 59 | Admitting: Family Medicine

## 2020-05-27 ENCOUNTER — Other Ambulatory Visit: Payer: Self-pay

## 2020-05-27 ENCOUNTER — Encounter: Payer: Self-pay | Admitting: Family Medicine

## 2020-05-27 DIAGNOSIS — G629 Polyneuropathy, unspecified: Secondary | ICD-10-CM

## 2020-05-27 DIAGNOSIS — Z4802 Encounter for removal of sutures: Secondary | ICD-10-CM | POA: Insufficient documentation

## 2020-05-27 NOTE — Progress Notes (Signed)
Darrell Contreras - 60 y.o. male MRN [id number]  Date of birth: 1960-06-11  Subjective No chief complaint on file.   HPI Darrell Contreras is a 60 y.o. male here today for suture removal.  Reports that he had a FUSS hair restoration procedure with Dr.Cooley in [place].  Sutures in place x3 weeks and needs removal at this time.  Denies pain or bleeding.    He also has had burning, tingling sensation in feet.  Started after wearing tennis shoes too tight while running last summer.  He is seeing podiatry soon for this.    ROS:  A comprehensive ROS was completed and negative except as noted per HPI  No Known Allergies  Past Medical History:  Diagnosis Date   Abnormal ECG during exercise stress test 11/09/2016   Abnormal stress ECG    a. 08/2016: stress test showing 2 mm horizontal ST depression in the inferior and lateral leads with occasional PVC's, overall findings suggestive of ischemia but possible repol abnormality.   Prostatitis    Rosacea    Seizures ([place])    states had seizure when IV was put in 15 years ago- pt states this isn't accurate    Past Surgical History:  Procedure Laterality Date   COLONOSCOPY     NASAL SEPTUM SURGERY     WISDOM TOOTH EXTRACTION      Social History   Socioeconomic History   Marital status: Legally Separated    Spouse name: Not on file   Number of children: 5   Years of education: Not on file   Highest education level: Not on file  Occupational History    Comment: Software  Tobacco Use   Smoking status: Never Smoker   Smokeless tobacco: Never Used  Vaping Use   Vaping Use: Never used  Substance and Sexual Activity   Alcohol use: Yes    Alcohol/week: 0.0 standard drinks    Comment: occasional   Drug use: No   Sexual activity: Not on file  Other Topics Concern   Not on file  Social History Narrative   Not on file   Social Determinants of Health   Financial Resource Strain: Not on file  Food Insecurity: Not on file   Transportation Needs: Not on file  Physical Activity: Not on file  Stress: Not on file  Social Connections: Not on file    Family History  Problem Relation Age of Onset   Cancer Mother 42       breast cancer   Cancer Father 25       skin cancer   Aneurysm Father    Colon cancer Neg Hx    Esophageal cancer Neg Hx    Rectal cancer Neg Hx    Stomach cancer Neg Hx     Health Maintenance  Topic Date Due   Hepatitis C Screening  12/12/2028 (Originally [birth year])   COLONOSCOPY (Pts 45-22yrs Insurance coverage will need to be confirmed)  08/14/2024   TETANUS/TDAP  01/18/2029   INFLUENZA VACCINE  Completed   COVID-19 Vaccine  Completed   HIV Screening  Completed     ----------------------------------------------------------------------------------------------------------------------------------------------------------------------------------------------------------------- Physical Exam Wt 199 lb 8 oz (90.5 kg)    BMI 24.94 kg/m   Physical Exam Constitutional:      Appearance: Normal appearance.  Cardiovascular:     Rate and Rhythm: Normal rate and regular rhythm.  Skin:    Comments: Running suture along back of scalp ~8 inches.  Removed without difficulty.  Wound appears well healed.  Neurological:     General: No focal deficit present.     Mental Status: He is alert.  Psychiatric:        Mood and Affect: Mood normal.        Behavior: Behavior normal.     ------------------------------------------------------------------------------------------------------------------------------------------------------------------------------------------------------------------- Assessment and Plan  Neuropathy Symptoms consistent with neuropathy.  He will see podiatry initially.  Discussed additional work up if needed including lab evaluation and possible nerve conduction.    Visit for suture removal Suture removed without difficulty.     No orders of the defined  types were placed in this encounter.   No follow-ups on file.    This visit occurred during the SARS-CoV-2 public health emergency.  Safety protocols were in place, including screening questions prior to the visit, additional usage of staff PPE, and extensive cleaning of exam room while observing appropriate contact time as indicated for disinfecting solutions.

## 2020-05-27 NOTE — Assessment & Plan Note (Signed)
Suture removed without difficulty.

## 2020-05-27 NOTE — Patient Instructions (Signed)
Neuropathic Pain Neuropathic pain is pain caused by damage to the nerves that are responsible for certain sensations in your body (sensory nerves). The pain can be caused by:  Damage to the sensory nerves that send signals to your spinal cord and brain (peripheral nervous system).  Damage to the sensory nerves in your brain or spinal cord (central nervous system). Neuropathic pain can make you more sensitive to pain. Even a minor sensation can feel very painful. This is usually a long-term condition that can be difficult to treat. The type of pain differs from person to person. It may:  Start suddenly (acute), or it may develop slowly and last for a long time (chronic).  Come and go as damaged nerves heal, or it may stay at the same level for years.  Cause emotional distress, loss of sleep, and a lower quality of life. What are the causes? The most common cause of this condition is diabetes. Many other diseases and conditions can also cause neuropathic pain. Causes of neuropathic pain can be classified as:  Toxic. This is caused by medicines and chemicals. The most common cause of toxic neuropathic pain is damage from cancer treatments (chemotherapy).  Metabolic. This can be caused by: ? Diabetes. This is the most common disease that damages the nerves. ? Lack of vitamin B from long-term alcohol abuse.  Traumatic. Any injury that cuts, crushes, or stretches a nerve can cause damage and pain. A common example is feeling pain after losing an arm or leg (phantom limb pain).  Compression-related. If a sensory nerve gets trapped or compressed for a long period of time, the blood supply to the nerve can be cut off.  Vascular. Many blood vessel diseases can cause neuropathic pain by decreasing blood supply and oxygen to nerves.  Autoimmune. This type of pain results from diseases in which the body's defense system (immune system) mistakenly attacks sensory nerves. Examples of autoimmune diseases  that can cause neuropathic pain include lupus and multiple sclerosis.  Infectious. Many types of viral infections can damage sensory nerves and cause pain. Shingles infection is a common cause of this type of pain.  Inherited. Neuropathic pain can be a symptom of many diseases that are passed down through families (genetic). What increases the risk? You are more likely to develop this condition if:  You have diabetes.  You smoke.  You drink too much alcohol.  You are taking certain medicines, including medicines that kill cancer cells (chemotherapy) or that treat immune system disorders. What are the signs or symptoms? The main symptom is pain. Neuropathic pain is often described as:  Burning.  Shock-like.  Stinging.  Hot or cold.  Itching. How is this diagnosed? No single test can diagnose neuropathic pain. It is diagnosed based on:  Physical exam and your symptoms. Your health care provider will ask you about your pain. You may be asked to use a pain scale to describe how bad your pain is.  Tests. These may be done to see if you have a high sensitivity to pain and to help find the cause and location of any sensory nerve damage. They include: ? Nerve conduction studies to test how well nerve signals travel through your sensory nerves (electrodiagnostic testing). ? Stimulating your sensory nerves through electrodes on your skin and measuring the response in your spinal cord and brain (somatosensory evoked potential).  Imaging studies, such as: ? X-rays. ? CT scan. ? MRI. How is this treated? Treatment for neuropathic pain may change   over time. You may need to try different treatment options or a combination of treatments. Some options include:  Treating the underlying cause of the neuropathy, such as diabetes, kidney disease, or vitamin deficiencies.  Stopping medicines that can cause neuropathy, such as chemotherapy.  Medicine to relieve pain. Medicines may  include: ? Prescription or over-the-counter pain medicine. ? Anti-seizure medicine. ? Antidepressant medicines. ? Pain-relieving patches that are applied to painful areas of skin. ? A medicine to numb the area (local anesthetic), which can be injected as a nerve block.  Transcutaneous nerve stimulation. This uses electrical currents to block painful nerve signals. The treatment is painless.  Alternative treatments, such as: ? Acupuncture. ? Meditation. ? Massage. ? Physical therapy. ? Pain management programs. ? Counseling. Follow these instructions at home: Medicines  Take over-the-counter and prescription medicines only as told by your health care provider.  Do not drive or use heavy machinery while taking prescription pain medicine.  If you are taking prescription pain medicine, take actions to prevent or treat constipation. Your health care provider may recommend that you: ? Drink enough fluid to keep your urine pale yellow. ? Eat foods that are high in fiber, such as fresh fruits and vegetables, whole grains, and beans. ? Limit foods that are high in fat and processed sugars, such as fried or sweet foods. ? Take an over-the-counter or prescription medicine for constipation.   Lifestyle  Have a good support system at home.  Consider joining a chronic pain support group.  Do not use any products that contain nicotine or tobacco, such as cigarettes and e-cigarettes. If you need help quitting, ask your health care provider.  Do not drink alcohol.   General instructions  Learn as much as you can about your condition.  Work closely with all your health care providers to find the treatment plan that works best for you.  Ask your health care provider what activities are safe for you.  Keep all follow-up visits as told by your health care provider. This is important. Contact a health care provider if:  Your pain treatments are not working.  You are having side effects  from your medicines.  You are struggling with tiredness (fatigue), mood changes, depression, or anxiety. Summary  Neuropathic pain is pain caused by damage to the nerves that are responsible for certain sensations in your body (sensory nerves).  Neuropathic pain may come and go as damaged nerves heal, or it may stay at the same level for years.  Neuropathic pain is usually a long-term condition that can be difficult to treat. Consider joining a chronic pain support group. This information is not intended to replace advice given to you by your health care provider. Make sure you discuss any questions you have with your health care provider. Document Revised: 07/21/2018 Document Reviewed: 04/16/2017 Elsevier Patient Education  2021 [employer].

## 2020-05-27 NOTE — Assessment & Plan Note (Signed)
Symptoms consistent with neuropathy.  He will see podiatry initially.  Discussed additional work up if needed including lab evaluation and possible nerve conduction.

## 2020-08-28 ENCOUNTER — Encounter: Payer: Self-pay | Admitting: Family Medicine

## 2020-08-30 NOTE — Telephone Encounter (Signed)
Please schedule appt w/ me.    Thanks!

## 2020-09-02 ENCOUNTER — Encounter: Payer: Self-pay | Admitting: Family Medicine

## 2020-09-02 ENCOUNTER — Ambulatory Visit (INDEPENDENT_AMBULATORY_CARE_PROVIDER_SITE_OTHER): Payer: 59 | Admitting: Family Medicine

## 2020-09-02 ENCOUNTER — Other Ambulatory Visit: Payer: Self-pay

## 2020-09-02 VITALS — BP 112/72 | HR 67 | Ht 75.0 in | Wt 191.0 lb

## 2020-09-02 DIAGNOSIS — G6289 Other specified polyneuropathies: Secondary | ICD-10-CM

## 2020-09-02 DIAGNOSIS — R739 Hyperglycemia, unspecified: Secondary | ICD-10-CM | POA: Diagnosis not present

## 2020-09-02 DIAGNOSIS — G629 Polyneuropathy, unspecified: Secondary | ICD-10-CM

## 2020-09-02 DIAGNOSIS — F411 Generalized anxiety disorder: Secondary | ICD-10-CM

## 2020-09-02 DIAGNOSIS — F5102 Adjustment insomnia: Secondary | ICD-10-CM

## 2020-09-02 LAB — POCT GLYCOSYLATED HEMOGLOBIN (HGB A1C): Hemoglobin A1C: 5.6 % (ref 4.0–5.6)

## 2020-09-02 MED ORDER — ZOLPIDEM TARTRATE 5 MG PO TABS: 5.0000 mg | ORAL_TABLET | Freq: Every evening | ORAL | 1 refills | Status: DC | PRN

## 2020-09-02 MED ORDER — TADALAFIL 20 MG PO TABS: ORAL_TABLET | ORAL | 0 refills | Status: DC

## 2020-09-02 MED ORDER — CLONAZEPAM 1 MG PO TABS: 1.0000 mg | ORAL_TABLET | Freq: Every day | ORAL | 4 refills | Status: DC

## 2020-09-02 MED ORDER — METFORMIN HCL ER 500 MG PO TB24: 500.0000 mg | ORAL_TABLET | Freq: Every day | ORAL | 0 refills | Status: DC

## 2020-09-02 NOTE — Assessment & Plan Note (Signed)
Borderline a1c at 5.6%.  He would like trial of metformin, will start metformin er 500mg  daily.  Side effects discussed.  He declines additional work up for neuropathy at this time including additional labs and NCV/EMG.

## 2020-09-02 NOTE — Assessment & Plan Note (Signed)
Clonazepam renewed for now.  If needing continuation of this will need to discuss addition so SSRI.

## 2020-09-02 NOTE — Telephone Encounter (Signed)
Appointment already made by Darrell Contreras. Coming in today.

## 2020-09-02 NOTE — Progress Notes (Signed)
Darrell Contreras - 60 y.o. male MRN [id number]  Date of birth: 1960/12/11  Subjective Chief Complaint  Patient presents with  . Diabetes    HPI Demarkus Remmel is a 60 y.o. male here today with complaint of neuropathic symptoms.  Describes numbness/tingling with burning sensation in bilateral feet.  He is concerned about blood sugars being elevated that may be contributing.  He would like to start metformin and see if this helps with any symptoms.  He does also admit to some increased EtOH intake due to stress related to divorce last year.  He has cut back on this quite a bit over the past few months.  He denies low back pain or neuropathic symptoms in upper extremities.   ROS:  A comprehensive ROS was completed and negative except as noted per HPI  No Known Allergies  Past Medical History:  Diagnosis Date  . Abnormal ECG during exercise stress test 11/09/2016  . Abnormal stress ECG    a. 08/2016: stress test showing 2 mm horizontal ST depression in the inferior and lateral leads with occasional PVC's, overall findings suggestive of ischemia but possible repol abnormality.  . Prostatitis   . Rosacea   . Seizures ([place])    states had seizure when IV was put in 15 years ago- pt states this isn't accurate    Past Surgical History:  Procedure Laterality Date  . COLONOSCOPY    . NASAL SEPTUM SURGERY    . WISDOM TOOTH EXTRACTION      Social History   Socioeconomic History  . Marital status: Divorced    Spouse name: Not on file  . Number of children: 5  . Years of education: Not on file  . Highest education level: Not on file  Occupational History    Comment: Software  Tobacco Use  . Smoking status: Never Smoker  . Smokeless tobacco: Never Used  Vaping Use  . Vaping Use: Never used  Substance and Sexual Activity  . Alcohol use: Yes    Alcohol/week: 0.0 standard drinks    Comment: occasional  . Drug use: No  . Sexual activity: Not on file  Other Topics Concern  . Not on file   Social History Narrative  . Not on file   Social Determinants of Health   Financial Resource Strain: Not on file  Food Insecurity: Not on file  Transportation Needs: Not on file  Physical Activity: Not on file  Stress: Not on file  Social Connections: Not on file    Family History  Problem Relation Age of Onset  . Cancer Mother 3       breast cancer  . Cancer Father 44       skin cancer  . Aneurysm Father   . Colon cancer Neg Hx   . Esophageal cancer Neg Hx   . Rectal cancer Neg Hx   . Stomach cancer Neg Hx     Health Maintenance  Topic Date Due  . Hepatitis C Screening  12/12/2028 (Originally 05/04/1978)  . INFLUENZA VACCINE  11/11/2020  . COLONOSCOPY (Pts 45-84yrs Insurance coverage will need to be confirmed)  08/14/2024  . TETANUS/TDAP  01/18/2029  . COVID-19 Vaccine  Completed  . HIV Screening  Completed  . HPV VACCINES  Aged Out     ----------------------------------------------------------------------------------------------------------------------------------------------------------------------------------------------------------------- Physical Exam BP 112/72 (BP Location: Left Arm, Patient Position: Sitting, Cuff Size: Normal)   Pulse 67   Ht 6\' 3"  (1.905 m)   Wt 191 lb (86.6 kg)   SpO2  100%   BMI 23.87 kg/m   Physical Exam Constitutional:      Appearance: Normal appearance.  Eyes:     General: No scleral icterus. Cardiovascular:     Rate and Rhythm: Normal rate and regular rhythm.  Pulmonary:     Effort: Pulmonary effort is normal.     Breath sounds: Normal breath sounds.  Musculoskeletal:     Cervical back: Neck supple.  Neurological:     General: No focal deficit present.     Mental Status: He is alert.  Psychiatric:        Mood and Affect: Mood normal.        Behavior: Behavior normal.      ------------------------------------------------------------------------------------------------------------------------------------------------------------------------------------------------------------------- Assessment and Plan  Neuropathy Borderline a1c at 5.6%.  He would like trial of metformin, will start metformin er 500mg  daily.  Side effects discussed.  He declines additional work up for neuropathy at this time including additional labs and NCV/EMG.   Insomnia ambien prescription renewed.    GAD (generalized anxiety disorder) Clonazepam renewed for now.  If needing continuation of this will need to discuss addition so SSRI.   Meds ordered this encounter  Medications  . clonazePAM (KLONOPIN) 1 MG tablet    Sig: Take 1 tablet (1 mg total) by mouth at bedtime.    Dispense:  30 tablet    Refill:  4  . tadalafil (CIALIS) 20 MG tablet    Sig: TAKE 1/2 TO 1 TABLET (10-20MG  TOTAL) BY MOUTH EVERY OTHER DAY AS NEEDED FOR ERECTILE DYSFUNCTION    Dispense:  45 tablet    Refill:  0  . zolpidem (AMBIEN) 5 MG tablet    Sig: Take 1 tablet (5 mg total) by mouth at bedtime as needed for sleep.    Dispense:  30 tablet    Refill:  1  . metFORMIN (GLUCOPHAGE-XR) 500 MG 24 hr tablet    Sig: Take 1 tablet (500 mg total) by mouth daily with breakfast.    Dispense:  90 tablet    Refill:  0    Return in about 4 months (around 01/03/2021) for Neuropathy.    This visit occurred during the SARS-CoV-2 public health emergency.  Safety protocols were in place, including screening questions prior to the visit, additional usage of staff PPE, and extensive cleaning of exam room while observing appropriate contact time as indicated for disinfecting solutions.

## 2020-09-02 NOTE — Assessment & Plan Note (Signed)
ambien prescription renewed.

## 2020-11-20 ENCOUNTER — Other Ambulatory Visit: Payer: Self-pay | Admitting: Family Medicine

## 2020-11-29 ENCOUNTER — Encounter: Payer: Self-pay | Admitting: Family Medicine

## 2020-11-29 MED ORDER — MINOXIDIL 2.5 MG PO TABS: 2.5000 mg | ORAL_TABLET | Freq: Every day | ORAL | 0 refills | Status: DC

## 2020-11-29 NOTE — Telephone Encounter (Signed)
We have not prescribed this medication for the patient previously.  Please review and refill if appropriate.  T. Keland Peyton, CMA  

## 2021-01-07 ENCOUNTER — Ambulatory Visit (INDEPENDENT_AMBULATORY_CARE_PROVIDER_SITE_OTHER): Payer: 59 | Admitting: Family Medicine

## 2021-01-07 ENCOUNTER — Other Ambulatory Visit: Payer: Self-pay

## 2021-01-07 ENCOUNTER — Encounter: Payer: Self-pay | Admitting: Family Medicine

## 2021-01-07 VITALS — BP 112/70 | HR 73 | Ht 76.0 in | Wt 197.0 lb

## 2021-01-07 DIAGNOSIS — F411 Generalized anxiety disorder: Secondary | ICD-10-CM | POA: Diagnosis not present

## 2021-01-07 DIAGNOSIS — G6289 Other specified polyneuropathies: Secondary | ICD-10-CM

## 2021-01-07 DIAGNOSIS — F5102 Adjustment insomnia: Secondary | ICD-10-CM | POA: Diagnosis not present

## 2021-01-07 DIAGNOSIS — G629 Polyneuropathy, unspecified: Secondary | ICD-10-CM | POA: Diagnosis not present

## 2021-01-07 MED ORDER — MINOXIDIL 2.5 MG PO TABS: 2.5000 mg | ORAL_TABLET | Freq: Every day | ORAL | 0 refills | Status: DC

## 2021-01-07 MED ORDER — ZOLPIDEM TARTRATE 5 MG PO TABS: 5.0000 mg | ORAL_TABLET | Freq: Every evening | ORAL | 1 refills | Status: DC | PRN

## 2021-01-07 MED ORDER — DUTASTERIDE 0.5 MG PO CAPS
0.5000 mg | ORAL_CAPSULE | Freq: Every day | ORAL | 2 refills | Status: DC
Start: 2021-01-07 — End: 2021-03-28

## 2021-01-07 MED ORDER — CLONAZEPAM 1 MG PO TABS: 1.0000 mg | ORAL_TABLET | Freq: Every day | ORAL | 4 refills | Status: DC

## 2021-01-07 NOTE — Patient Instructions (Signed)
Peripheral Neuropathy ?Peripheral neuropathy is a type of nerve damage. It affects nerves that carry signals between the spinal cord and the arms, legs, and the rest of the body (peripheral nerves). It does not affect nerves in the spinal cord or brain. In peripheral neuropathy, one nerve or a group of nerves may be damaged. Peripheral neuropathy is a broad category that includes many specific nerve disorders, like diabetic neuropathy, hereditary neuropathy, and carpal tunnel syndrome. ?What are the causes? ?This condition may be caused by: ?Diabetes. This is the most common cause of peripheral neuropathy. ?Nerve injury. ?Pressure or stress on a nerve that lasts a long time. ?Lack (deficiency) of B vitamins. This can result from alcoholism, poor diet, or a restricted diet. ?Infections. ?Autoimmune diseases, such as rheumatoid arthritis and systemic lupus erythematosus. ?Nerve diseases that are passed from parent to child (inherited). ?Some medicines, such as cancer medicines (chemotherapy). ?Poisonous (toxic) substances, such as lead and mercury. ?Too little blood flowing to the legs. ?Kidney disease. ?Thyroid disease. ?In some cases, the cause of this condition is not known. ?What are the signs or symptoms? ?Symptoms of this condition depend on which of your nerves is damaged. Common symptoms include: ?Loss of feeling (numbness) in the feet, hands, or both. ?Tingling in the feet, hands, or both. ?Burning pain. ?Very sensitive skin. ?Weakness. ?Not being able to move a part of the body (paralysis). ?Muscle twitching. ?Clumsiness or poor coordination. ?Loss of balance. ?Not being able to control your bladder. ?Feeling dizzy. ?Sexual problems. ?How is this diagnosed? ?Diagnosing and finding the cause of peripheral neuropathy can be difficult. Your health care provider will take your medical history and do a physical exam. A neurological exam will also be done. This involves checking things that are affected by your  brain, spinal cord, and nerves (nervous system). For example, your health care provider will check your reflexes, how you move, and what you can feel. ?You may have other tests, such as: ?Blood tests. ?Electromyogram (EMG) and nerve conduction tests. These tests check nerve function and how well the nerves are controlling the muscles. ?Imaging tests, such as CT scans or MRI to rule out other causes of your symptoms. ?Removing a small piece of nerve to be examined in a lab (nerve biopsy). ?Removing and examining a small amount of the fluid that surrounds the brain and spinal cord (lumbar puncture). ?How is this treated? ?Treatment for this condition may involve: ?Treating the underlying cause of the neuropathy, such as diabetes, kidney disease, or vitamin deficiencies. ?Stopping medicines that can cause neuropathy, such as chemotherapy. ?Medicine to help relieve pain. Medicines may include: ?Prescription or over-the-counter pain medicine. ?Antiseizure medicine. ?Antidepressants. ?Pain-relieving patches that are applied to painful areas of skin. ?Surgery to relieve pressure on a nerve or to destroy a nerve that is causing pain. ?Physical therapy to help improve movement and balance. ?Devices to help you move around (assistive devices). ?Follow these instructions at home: ?Medicines ?Take over-the-counter and prescription medicines only as told by your health care provider. Do not take any other medicines without first asking your health care provider. ?Do not drive or use heavy machinery while taking prescription pain medicine. ?Lifestyle ? ?Do not use any products that contain nicotine or tobacco, such as cigarettes and e-cigarettes. Smoking keeps blood from reaching damaged nerves. If you need help quitting, ask your health care provider. ?Avoid or limit alcohol. Too much alcohol can cause a vitamin B deficiency, and vitamin B is needed for healthy nerves. ?Eat a   healthy diet. This includes: ?Eating foods that are  high in fiber, such as fresh fruits and vegetables, whole grains, and beans. ?Limiting foods that are high in fat and processed sugars, such as fried or sweet foods. ?General instructions ? ?If you have diabetes, work closely with your health care provider to keep your blood sugar under control. ?If you have numbness in your feet: ?Check every day for signs of injury or infection. Watch for redness, warmth, and swelling. ?Wear padded socks and comfortable shoes. These help protect your feet. ?Develop a good support system. Living with peripheral neuropathy can be stressful. Consider talking with a mental health specialist or joining a support group. ?Use assistive devices and attend physical therapy as told by your health care provider. This may include using a walker or a cane. ?Keep all follow-up visits as told by your health care provider. This is important. ?Contact a health care provider if: ?You have new signs or symptoms of peripheral neuropathy. ?You are struggling emotionally from dealing with peripheral neuropathy. ?Your pain is not well-controlled. ?Get help right away if: ?You have an injury or infection that is not healing normally. ?You develop new weakness in an arm or leg. ?You have fallen or do so frequently. ?Summary ?Peripheral neuropathy is when the nerves in the arms, or legs are damaged, resulting in numbness, weakness, or pain. ?There are many causes of peripheral neuropathy, including diabetes, pinched nerves, vitamin deficiencies, autoimmune disease, and hereditary conditions. ?Diagnosing and finding the cause of peripheral neuropathy can be difficult. Your health care provider will take your medical history, do a physical exam, and do tests, including blood tests and nerve function tests. ?Treatment involves treating the underlying cause of the neuropathy and taking medicines to help control pain. Physical therapy and assistive devices may also help. ?This information is not intended to  replace advice given to you by your health care provider. Make sure you discuss any questions you have with your health care provider. ?Document Revised: 01/09/2020 Document Reviewed: 01/09/2020 ?Elsevier Patient Education ? 2022 [employer] ? ?

## 2021-01-09 LAB — ANTI-NUCLEAR AB-TITER (ANA TITER): ANA Titer 1: 1:80 {titer} — ABNORMAL HIGH

## 2021-01-09 LAB — SEDIMENTATION RATE: Sed Rate: 2 mm/h (ref 0–20)

## 2021-01-09 LAB — TSH: TSH: 1.16 mIU/L (ref 0.40–4.50)

## 2021-01-09 LAB — VITAMIN B12: Vitamin B-12: 1465 pg/mL — ABNORMAL HIGH (ref 200–1100)

## 2021-01-09 LAB — ANA: Anti Nuclear Antibody (ANA): POSITIVE — AB

## 2021-01-12 NOTE — Assessment & Plan Note (Signed)
Recommend a nerve conduction study however he would like to continue to hold off on this for now.  Labs ordered for additional work-up including metabolic causes and inflammatory causes.  He

## 2021-01-12 NOTE — Assessment & Plan Note (Signed)
Clonazepam renewed.

## 2021-01-12 NOTE — Progress Notes (Signed)
Darrell Contreras - 60 y.o. male MRN [id number]  Date of birth: 1960/12/06  Subjective Chief Complaint  Patient presents with   diabetic neuropathy    HPI Darrell Contreras is a 60 year old male here today who follow-up of neuropathy.  He continues to have difficulty with neuropathy in his feet.  He does find the numbness and tingling sensation uncomfortable.  He has no prior history of diabetes.  He does note that when this started he recently purchased a new pair shoes which may have been too tight.  He is not currently taking anything to help with management of symptoms.  He continues to have anxiety and sleeping difficulty related to his divorce.  He does take clonazepam as needed.  Uses Ambien occasionally to sleep.  ROS:  A comprehensive ROS was completed and negative except as noted per HPI  No Known Allergies  Past Medical History:  Diagnosis Date   Abnormal ECG during exercise stress test 11/09/2016   Abnormal stress ECG    a. 08/2016: stress test showing 2 mm horizontal ST depression in the inferior and lateral leads with occasional PVC's, overall findings suggestive of ischemia but possible repol abnormality.   Prostatitis    Rosacea    Seizures ([place])    states had seizure when IV was put in 15 years ago- pt states this isn't accurate    Past Surgical History:  Procedure Laterality Date   COLONOSCOPY     NASAL SEPTUM SURGERY     WISDOM TOOTH EXTRACTION      Social History   Socioeconomic History   Marital status: Divorced    Spouse name: Not on file   Number of children: 5   Years of education: Not on file   Highest education level: Not on file  Occupational History    Comment: Software  Tobacco Use   Smoking status: Never   Smokeless tobacco: Never  Vaping Use   Vaping Use: Never used  Substance and Sexual Activity   Alcohol use: Yes    Alcohol/week: 0.0 standard drinks    Comment: occasional   Drug use: No   Sexual activity: Not on file  Other Topics Concern    Not on file  Social History Narrative   Not on file   Social Determinants of Health   Financial Resource Strain: Not on file  Food Insecurity: Not on file  Transportation Needs: Not on file  Physical Activity: Not on file  Stress: Not on file  Social Connections: Not on file    Family History  Problem Relation Age of Onset   Cancer Mother 36       breast cancer   Cancer Father 36       skin cancer   Aneurysm Father    Colon cancer Neg Hx    Esophageal cancer Neg Hx    Rectal cancer Neg Hx    Stomach cancer Neg Hx     Health Maintenance  Topic Date Due   Zoster Vaccines- Shingrix (2 of 2) 04/07/2019   COVID-19 Vaccine (4 - Booster for Moderna series) 03/19/2020   INFLUENZA VACCINE  07/11/2021 (Originally 11/11/2020)   Hepatitis C Screening  12/12/2028 (Originally 05/04/1978)   COLONOSCOPY (Pts 45-93yrs Insurance coverage will need to be confirmed)  08/14/2024   TETANUS/TDAP  01/18/2029   HIV Screening  Completed   HPV VACCINES  Aged Out     ----------------------------------------------------------------------------------------------------------------------------------------------------------------------------------------------------------------- Physical Exam BP 112/70 (BP Location: Right Arm, Patient Position: Sitting, Cuff Size: Normal)  Pulse 73   Ht 6\' 4"  (1.93 m)   Wt 197 lb (89.4 kg)   SpO2 100%   BMI 23.98 kg/m   Physical Exam Constitutional:      Appearance: Normal appearance.  HENT:     Head: Normocephalic and atraumatic.  Eyes:     General: No scleral icterus. Cardiovascular:     Rate and Rhythm: Normal rate and regular rhythm.     Pulses: Normal pulses.  Pulmonary:     Effort: Pulmonary effort is normal.     Breath sounds: Normal breath sounds.  Neurological:     General: No focal deficit present.     Mental Status: He is alert and oriented to person, place, and time.     Sensory: Sensory deficit (Mildly diminished sensation in bilateral  feet.) present.    ------------------------------------------------------------------------------------------------------------------------------------------------------------------------------------------------------------------- Assessment and Plan  Insomnia Prescription for Ambien renewed.  GAD (generalized anxiety disorder) Clonazepam renewed.  Neuropathy Recommend a nerve conduction study however he would like to continue to hold off on this for now.  Labs ordered for additional work-up including metabolic causes and inflammatory causes.  He   Meds ordered this encounter  Medications   dutasteride (AVODART) 0.5 MG capsule    Sig: Take 1 capsule (0.5 mg total) by mouth daily.    Dispense:  30 capsule    Refill:  2   minoxidil (LONITEN) 2.5 MG tablet    Sig: Take 1 tablet (2.5 mg total) by mouth daily.    Dispense:  90 tablet    Refill:  0   clonazePAM (KLONOPIN) 1 MG tablet    Sig: Take 1 tablet (1 mg total) by mouth at bedtime.    Dispense:  30 tablet    Refill:  4   zolpidem (AMBIEN) 5 MG tablet    Sig: Take 1 tablet (5 mg total) by mouth at bedtime as needed for sleep.    Dispense:  30 tablet    Refill:  1    No follow-ups on file.    This visit occurred during the SARS-CoV-2 public health emergency.  Safety protocols were in place, including screening questions prior to the visit, additional usage of staff PPE, and extensive cleaning of exam room while observing appropriate contact time as indicated for disinfecting solutions.

## 2021-01-12 NOTE — Assessment & Plan Note (Signed)
Prescription for Ambien renewed.

## 2021-03-28 ENCOUNTER — Ambulatory Visit (INDEPENDENT_AMBULATORY_CARE_PROVIDER_SITE_OTHER): Payer: 59 | Admitting: Family Medicine

## 2021-03-28 ENCOUNTER — Encounter: Payer: Self-pay | Admitting: Family Medicine

## 2021-03-28 ENCOUNTER — Other Ambulatory Visit: Payer: Self-pay

## 2021-03-28 VITALS — BP 126/77 | HR 69 | Temp 98.4°F | Ht 76.0 in | Wt 196.0 lb

## 2021-03-28 DIAGNOSIS — Z0000 Encounter for general adult medical examination without abnormal findings: Secondary | ICD-10-CM | POA: Diagnosis not present

## 2021-03-28 MED ORDER — MINOXIDIL 2.5 MG PO TABS: 2.5000 mg | ORAL_TABLET | Freq: Every day | ORAL | 0 refills | Status: DC

## 2021-03-28 MED ORDER — DUTASTERIDE 0.5 MG PO CAPS: 0.5000 mg | ORAL_CAPSULE | Freq: Every day | ORAL | 2 refills | Status: DC

## 2021-03-28 NOTE — Progress Notes (Signed)
BP 126/77 (BP Location: Left Arm, Patient Position: Sitting, Cuff Size: Normal)    Pulse 69    Temp 98.4 F (36.9 C) (Oral)    Ht _0  (1.93 m)    Wt 196 lb 0.6 oz (88.9 kg)    SpO2 98%    BMI 23.86 kg/m    Subjective:    Patient ID: Darrell Contreras, male    DOB: Jul 16, 1960, 60 y.o.   MRN: [id number]  HPI: Darrell Contreras is a 60 y.o. male presenting on 03/28/2021 for comprehensive medical examination. 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 He denies nictoine use  He denies illegal substance use   He is not currently sexually active  He denies concerns today about STI  He denies concerns about skin changes today:  He denies concerns about bowel changes today:  He denies concerns about bladder changes today:   Depression Screen done today and results listed below:  [facility]21 01/07/2021 03/21/2020 02/01/2020 02/10/2019  Decreased Interest 1 0 0 0 0  Down, Depressed, Hopeless 0 0 0 0 0  PHQ - 2 Score 1 0 0 0 0  Altered sleeping - - - 2 0  Tired, decreased energy - - - 1 0  Change in appetite - - - 1 0  Feeling bad or failure about yourself  - - - 0 0  Trouble concentrating - - - 0 0  Moving slowly or fidgety/restless - - - 0 0  Suicidal thoughts - - - 0 0  PHQ-9 Score - - - 4 0  Difficult doing work/chores - - - Not difficult at all Not difficult at all    The patient does not have a history of falls.      Past Medical History:  Past Medical History:  Diagnosis Date   Abnormal ECG during exercise stress test 11/09/2016   Abnormal stress ECG    a. 08/2016: stress test showing 2  mm horizontal ST depression in the inferior and lateral leads with occasional PVC's, overall findings suggestive of ischemia but possible repol abnormality.   Prostatitis    Rosacea    [place]    states had seizure when IV was put in 15 years ago- pt states this isn't accurate    Surgical History:  Past Surgical History:  Procedure Laterality Date   COLONOSCOPY     NASAL SEPTUM SURGERY     WISDOM TOOTH EXTRACTION      Medications:  Current Outpatient Medications on File Prior to Visit  Medication Sig   Azelaic Acid (FINACEA) 15 % cream After skin is thoroughly washed and patted dry, gently but thoroughly massage a thin film of azelaic acid gel into the affected area twice daily, in the morning and evening.   clonazePAM (KLONOPIN) 1 MG tablet Take 1 tablet (1 mg total) by mouth at bedtime.   hydrOXYzine (ATARAX/VISTARIL) 25 MG tablet Take 25 mg by mouth 2 (two) times daily.   Multiple Vitamins-Minerals (ZINC PO) Take by mouth daily.   tadalafil (CIALIS) 20 MG tablet TAKE 1/2 TO 1 TABLET (10-20MG TOTAL) BY MOUTH  EVERY OTHER DAY AS NEEDED FOR ERECTILE DYSFUNCTION   valACYclovir (VALTREX) 1000 MG tablet Take 2 tablets (2,000 mg total) by mouth 2 (two) times daily. For coldsore at onset of symptoms   zolpidem (AMBIEN) 5 MG tablet Take 1 tablet (5 mg total) by mouth at bedtime as needed for sleep.   No current facility-administered medications on file prior to visit.    Allergies:  Not on File  Social History:  Social History   Socioeconomic History   Marital status: Divorced    Spouse name: Not on file   Number of children: 5   Years of education: Not on file   Highest education level: Not on file  Occupational History    Comment: Software  Tobacco Use   Smoking status: Never   Smokeless tobacco: Never  Vaping Use   Vaping Use: Never used  Substance and Sexual Activity   Alcohol use: Yes    Alcohol/week: 0.0 standard drinks    Comment: occasional   Drug use: No    Sexual activity: Not on file  Other Topics Concern   Not on file  Social History Narrative   Not on file   Social Determinants of Health   Financial Resource Strain: Not on file  Food Insecurity: Not on file  Transportation Needs: Not on file  Physical Activity: Not on file  Stress: Not on file  Social Connections: Not on file  Intimate Partner Violence: Not on file   Social History   Tobacco Use  Smoking Status Never  Smokeless Tobacco Never   Social History   Substance and Sexual Activity  Alcohol Use Yes   Alcohol/week: 0.0 standard drinks   Comment: occasional    Family History:  Family History  Problem Relation Age of Onset   Cancer Mother 33       breast cancer   Cancer Father 69       skin cancer   Aneurysm Father    Colon cancer Neg Hx    Esophageal cancer Neg Hx    Rectal cancer Neg Hx    Stomach cancer Neg Hx     Past medical history, surgical history, medications, allergies, family history and social history reviewed with patient today and changes made to appropriate areas of the chart.   All ROS negative except what is listed above and in the HPI.      Objective:    BP 126/77 (BP Location: Left Arm, Patient Position: Sitting, Cuff Size: Normal)    Pulse 69    Temp 98.4 F (36.9 C) (Oral)    Ht _0  (1.93 m)    Wt 196 lb 0.6 oz (88.9 kg)    SpO2 98%    BMI 23.86 kg/m   Wt Readings from Last 3 Encounters:  03/28/21 196 lb 0.6 oz (88.9 kg)  01/07/21 197 lb (89.4 kg)  09/02/20 191 lb (86.6 kg)    Physical Exam Vitals reviewed.  Constitutional:      Appearance: Normal appearance. He is normal weight.  HENT:     Head: Normocephalic and atraumatic.     Right Ear: Tympanic membrane normal.     Left Ear: Tympanic membrane normal.     Mouth/Throat:     Mouth: Mucous membranes are moist.     Pharynx: Oropharynx is clear.  Eyes:     Extraocular Movements: Extraocular movements intact.     Conjunctiva/sclera: Conjunctivae normal.     Pupils:  Pupils are equal, round, and reactive to  light.  Cardiovascular:     Rate and Rhythm: Normal rate and regular rhythm.     Pulses: Normal pulses.     Heart sounds: Normal heart sounds.  Pulmonary:     Effort: Pulmonary effort is normal.     Breath sounds: Normal breath sounds.  Abdominal:     General: Abdomen is flat. Bowel sounds are normal.     Palpations: Abdomen is soft.  Musculoskeletal:        General: Normal range of motion.     Cervical back: Normal range of motion and neck supple.  Skin:    General: Skin is warm and dry.     Capillary Refill: Capillary refill takes less than 2 seconds.  Neurological:     General: No focal deficit present.     Mental Status: He is alert and oriented to person, place, and time.  Psychiatric:        Mood and Affect: Mood normal.        Behavior: Behavior normal.        Thought Content: Thought content normal.        Judgment: Judgment normal.     Results for orders placed or performed in visit on 01/07/21  B12  Result Value Ref Range   Vitamin B-12 1,465 (H) 200 - 1,100 pg/mL  Sed Rate (ESR)  Result Value Ref Range   Sed Rate 2 0 - 20 mm/h  Antinuclear Antib (ANA)  Result Value Ref Range   Anti Nuclear Antibody (ANA) POSITIVE (A) NEGATIVE  TSH  Result Value Ref Range   TSH 1.16 0.40 - 4.50 mIU/L  Anti-nuclear ab-titer (ANA titer)  Result Value Ref Range   ANA Titer 1 1:80 (H) titer   ANA Pattern 1 Nuclear, Speckled (A)       Assessment & Plan:   Problem List Items Addressed This Visit   None Visit Diagnoses     Annual physical exam    -  Primary   Relevant Orders   CBC with Differential/Platelet   Comprehensive metabolic panel   Lipid panel         LABORATORY TESTING:  Health maintenance labs ordered today as discussed above.  - STI testing: deferred  The natural history of prostate cancer and ongoing controversy regarding screening and potential treatment outcomes of prostate cancer has been discussed with the  patient. The meaning of a false positive PSA and a false negative PSA has been discussed. He indicates understanding of the limitations of this screening test and does not wish  to proceed with screening PSA testing.  IMMUNIZATIONS:   - Tdap: Tetanus vaccination status reviewed: last tetanus booster within 10 years. - Influenza: Given elsewhere - Pneumovax: Not applicable - Prevnar: Not applicable - HPV: Not applicable - Shingrix vaccine: Refused - COVID-19: Up to date  SCREENING: - Colonoscopy: Up to date  Discussed with patient purpose of the colonoscopy is to detect colon cancer at curable precancerous or early stages  - AAA Screening: Not applicable  -Hearing Test: Not applicable  -Spirometry: Not applicable  - Lung Cancer Screening: Not applicable    PATIENT COUNSELING:    Sexuality: Discussed sexually transmitted diseases, partner selection, use of condoms, avoidance of unintended pregnancy, and contraceptive alternatives.    I discussed with the patient that most people either abstain from alcohol or drink within safe limits (<=14/week and <=4 drinks/occasion for males, <=7/weeks and <= 3 drinks/occasion for females) and that the risk for alcohol disorders and other  health effects rises proportionally with the number of drinks per week and how often a drinker exceeds daily limits.  Discussed cessation/primary prevention of drug use and availability of treatment for abuse.   Diet: Encouraged to adjust caloric intake to maintain or achieve ideal body weight, to reduce intake of dietary saturated fat and total fat, to limit sodium intake by avoiding high sodium foods and not adding table salt, and to maintain adequate dietary potassium and calcium preferably from fresh fruits, vegetables, and low-fat dairy products.    Emphasized the importance of regular exercise  Injury prevention: Discussed safety belts, safety helmets, smoke detector, smoking near bedding or upholstery.    Dental health: Discussed importance of regular tooth brushing, flossing, and dental visits.   Follow up plan:  Return in about 6 months (around 09/26/2021) for routine follow-up.   [employer]cell [employer]ails Olevia Bowens, DNP, FNP-C

## 2021-03-29 LAB — COMPREHENSIVE METABOLIC PANEL
AG Ratio: 1.7 (calc) (ref 1.0–2.5)
ALT: 19 U/L (ref 9–46)
AST: 25 U/L (ref 10–35)
Albumin: 4.4 g/dL (ref 3.6–5.1)
Alkaline phosphatase (APISO): 53 U/L (ref 35–144)
BUN: 20 mg/dL (ref 7–25)
CO2: 20 mmol/L (ref 20–32)
Calcium: 9.5 mg/dL (ref 8.6–10.3)
Chloride: 106 mmol/L (ref 98–110)
Creat: 1 mg/dL (ref 0.70–1.35)
Globulin: 2.6 g/dL (calc) (ref 1.9–3.7)
Glucose, Bld: 100 mg/dL — ABNORMAL HIGH (ref 65–99)
Potassium: 4.5 mmol/L (ref 3.5–5.3)
Sodium: 137 mmol/L (ref 135–146)
Total Bilirubin: 0.8 mg/dL (ref 0.2–1.2)
Total Protein: 7 g/dL (ref 6.1–8.1)

## 2021-03-29 LAB — CBC WITH DIFFERENTIAL/PLATELET
Absolute Monocytes: 487 cells/uL (ref 200–950)
Basophils Absolute: 50 cells/uL (ref 0–200)
Basophils Relative: 0.9 %
Eosinophils Absolute: 112 cells/uL (ref 15–500)
Eosinophils Relative: 2 %
HCT: 44.4 % (ref 38.5–50.0)
Hemoglobin: 15.1 g/dL (ref 13.2–17.1)
Lymphs Abs: 1635 cells/uL (ref 850–3900)
MCH: 31 pg (ref 27.0–33.0)
MCHC: 34 g/dL (ref 32.0–36.0)
MCV: 91.2 fL (ref 80.0–100.0)
MPV: 10.9 fL (ref 7.5–12.5)
Monocytes Relative: 8.7 %
Neutro Abs: 3315 cells/uL (ref 1500–7800)
Neutrophils Relative %: 59.2 %
Platelets: 219 10*3/uL (ref 140–400)
RBC: 4.87 10*6/uL (ref 4.20–5.80)
RDW: 12.3 % (ref 11.0–15.0)
Total Lymphocyte: 29.2 %
WBC: 5.6 10*3/uL (ref 3.8–10.8)

## 2021-03-29 LAB — LIPID PANEL
Cholesterol: 149 mg/dL (ref <200)
HDL: 48 mg/dL (ref >=40)
LDL Cholesterol (Calc): 80 mg/dL (calc)
Non-HDL Cholesterol (Calc): 101 mg/dL (calc) (ref <130)
Total CHOL/HDL Ratio: 3.1 (calc) (ref <5.0)
Triglycerides: 112 mg/dL (ref <150)

## 2021-04-03 ENCOUNTER — Other Ambulatory Visit: Payer: Self-pay | Admitting: Family Medicine

## 2021-06-22 ENCOUNTER — Other Ambulatory Visit: Payer: Self-pay | Admitting: Family Medicine

## 2021-07-20 ENCOUNTER — Other Ambulatory Visit: Payer: Self-pay | Admitting: Family Medicine

## 2021-07-20 DIAGNOSIS — F5102 Adjustment insomnia: Secondary | ICD-10-CM

## 2021-07-21 ENCOUNTER — Telehealth: Payer: Self-pay

## 2021-07-21 ENCOUNTER — Encounter: Payer: Self-pay | Admitting: Family Medicine

## 2021-07-21 MED ORDER — CLONAZEPAM 1 MG PO TABS: 1.0000 mg | ORAL_TABLET | Freq: Every day | ORAL | 4 refills | Status: DC

## 2021-07-21 MED ORDER — DUTASTERIDE 0.5 MG PO CAPS: 0.5000 mg | ORAL_CAPSULE | Freq: Every day | ORAL | 2 refills | Status: DC

## 2021-07-21 NOTE — Telephone Encounter (Signed)
Initiated Prior authorization QIX:[id number] 0.'5MG'$  capsules ?Via: Covermymeds ?Case/Key:[id number] ?Status: approved  as of 07/21/21 ?Reason:Coverage Start Date:06/21/2021;Coverage End Date:07/21/2022; ?Notified Pt via: Mychart ?

## 2021-07-23 ENCOUNTER — Other Ambulatory Visit: Payer: Self-pay | Admitting: Family Medicine

## 2021-09-01 ENCOUNTER — Other Ambulatory Visit: Payer: Self-pay | Admitting: Family Medicine

## 2021-09-02 NOTE — Telephone Encounter (Signed)
Called patient to schedule 6 month follow up & he said "What do I need an appt for?" I said a 6 month f/u with your PCP to continue getting RX filled. He said "let me think about that and I will call you back." [facility]

## 2021-09-02 NOTE — Telephone Encounter (Signed)
Pls contact pt to schedule appt. Return in about 6 months (around 09/26/2021) for routine follow-up.Thanks

## 2021-10-20 ENCOUNTER — Telehealth: Payer: Self-pay | Admitting: General Practice

## 2021-10-20 NOTE — Telephone Encounter (Addendum)
Transition Care Management Follow-up Telephone Call Date of discharge and from where: 10/18/21 from [facility] medical center How have you been since you were released from the hospital? Doing much better. Does not want to come in for a hospital follow up. States he is doing much better. Will come in to get stitches removed. Any questions or concerns? No  Items Reviewed: Did the pt receive and understand the discharge instructions provided? Yes  Medications obtained and verified? No  Other? No  Any new allergies since your discharge? No  Dietary orders reviewed? Yes Do you have support at home? Yes   Home Care and Equipment/Supplies: Were home health services ordered? no  Functional Questionnaire: (I = Independent and D = Dependent) ADLs: I  Bathing/Dressing- I  Meal Prep- I  Eating- I  Maintaining continence- I  Transferring/Ambulation- I  Managing Meds- I  Follow up appointments reviewed:  PCP Hospital f/u appt confirmed? Yes  Scheduled to see the PCP for stiches removal on 10/24/21 @ 1100. [facility] f/u appt confirmed? No   Are transportation arrangements needed? No  If their condition worsens, is the pt aware to call PCP or go to the Emergency Dept.? Yes Was the patient provided with contact information for the PCP's office or ED? Yes Was to pt encouraged to call back with questions or concerns? Yes

## 2021-10-24 ENCOUNTER — Ambulatory Visit (INDEPENDENT_AMBULATORY_CARE_PROVIDER_SITE_OTHER): Payer: 59 | Admitting: Family Medicine

## 2021-10-24 DIAGNOSIS — Z4802 Encounter for removal of sutures: Secondary | ICD-10-CM

## 2021-10-24 NOTE — Progress Notes (Signed)
Pt here for suture removal.   He has 2 stiches that need to be removed from his forehead. Stitches removed with no problems pt tolerated well. I had Dr. Zigmund Daniel check the area prior to pt leaving.

## 2021-10-24 NOTE — Progress Notes (Signed)
Medical screening examination/treatment was performed by qualified clinical staff member and as supervising physician I was immediately available for consultation/collaboration. I have reviewed documentation and agree with assessment and plan.  Aiyannah Fayad, DO  

## 2021-11-25 ENCOUNTER — Other Ambulatory Visit: Payer: Self-pay | Admitting: Family Medicine

## 2021-12-10 ENCOUNTER — Ambulatory Visit (INDEPENDENT_AMBULATORY_CARE_PROVIDER_SITE_OTHER): Payer: 59 | Admitting: Family Medicine

## 2021-12-10 ENCOUNTER — Encounter: Payer: Self-pay | Admitting: Family Medicine

## 2021-12-10 VITALS — BP 102/65 | HR 59 | Ht 76.0 in | Wt 193.0 lb

## 2021-12-10 DIAGNOSIS — G629 Polyneuropathy, unspecified: Secondary | ICD-10-CM

## 2021-12-10 DIAGNOSIS — Z113 Encounter for screening for infections with a predominantly sexual mode of transmission: Secondary | ICD-10-CM | POA: Diagnosis not present

## 2021-12-10 MED ORDER — METFORMIN HCL ER 500 MG PO TB24: ORAL_TABLET | ORAL | 3 refills | Status: DC

## 2021-12-10 NOTE — Assessment & Plan Note (Signed)
Neuropathy has remained about the same.  He does noticed this a little more when drinking alcohol.  Checking thiamine levels in addition to SPEP.  We did discuss adding alpha lipoic acid supplement.  He will pick this up and try it.

## 2021-12-10 NOTE — Patient Instructions (Signed)
Try adding alpha lipoic acid for neuropathy.  We'll be in touch with lab results.

## 2021-12-10 NOTE — Assessment & Plan Note (Signed)
STI screening per lab orders.

## 2021-12-10 NOTE — Progress Notes (Signed)
Darrell Contreras - 62 y.o. male MRN [id number]  Date of birth: 08/22/1960  Subjective No chief complaint on file.   HPI Darrell Contreras is a 61 year old male here today for follow-up visit.  Overall he is doing fairly well.  He is interested in having STI screenings.  Reports that his ex-wife did have an affair.  He is involved with a new partner as well.  He denies any symptoms at this time.  Additionally, he continues to have neuropathy symptoms.  He has had work-up previously for this.  Continues on metformin for history of prediabetes.  ROS:  A comprehensive ROS was completed and negative except as noted per HPI   No Known Allergies  Past Medical History:  Diagnosis Date   Abnormal ECG during exercise stress test 11/09/2016   Abnormal stress ECG    a. 08/2016: stress test showing 2 mm horizontal ST depression in the inferior and lateral leads with occasional PVC's, overall findings suggestive of ischemia but possible repol abnormality.   Prostatitis    Rosacea    Seizures ([place])    states had seizure when IV was put in 15 years ago- pt states this isn't accurate    Past Surgical History:  Procedure Laterality Date   COLONOSCOPY     NASAL SEPTUM SURGERY     WISDOM TOOTH EXTRACTION      Social History   Socioeconomic History   Marital status: Divorced    Spouse name: Not on file   Number of children: 5   Years of education: Not on file   Highest education level: Not on file  Occupational History    Comment: Software  Tobacco Use   Smoking status: Never   Smokeless tobacco: Never  Vaping Use   Vaping Use: Never used  Substance and Sexual Activity   Alcohol use: Yes    Alcohol/week: 0.0 standard drinks of alcohol    Comment: occasional   Drug use: No   Sexual activity: Not on file  Other Topics Concern   Not on file  Social History Narrative   Not on file   Social Determinants of Health   Financial Resource Strain: Not on file  Food Insecurity: Not on file  Transportation  Needs: Not on file  Physical Activity: Insufficiently Active (09/30/2017)   Exercise Vital Sign    Days of Exercise per Week: 2 days    Minutes of Exercise per Session: 10 min  Stress: Not on file  Social Connections: Not on file    Family History  Problem Relation Age of Onset   Cancer Mother 25       breast cancer   Cancer Father 69       skin cancer   Aneurysm Father    Colon cancer Neg Hx    Esophageal cancer Neg Hx    Rectal cancer Neg Hx    Stomach cancer Neg Hx     Health Maintenance  Topic Date Due   INFLUENZA VACCINE  07/12/2022 (Originally 11/11/2021)   Hepatitis C Screening  12/12/2028 (Originally 05/04/1978)   COLONOSCOPY (Pts 45-35yr Insurance coverage will need to be confirmed)  08/14/2024   TETANUS/TDAP  01/18/2029   COVID-19 Vaccine  Completed   HIV Screening  Completed   Zoster Vaccines- Shingrix  Completed   HPV VACCINES  Aged Out     ----------------------------------------------------------------------------------------------------------------------------------------------------------------------------------------------------------------- Physical Exam BP 102/65 (BP Location: Left Arm, Patient Position: Sitting, Cuff Size: Normal)   Pulse (!) 59   Ht '6\' 4"'$  (1.93 m)  Wt 193 lb (87.5 kg)   SpO2 97%   BMI 23.49 kg/m   Physical Exam Constitutional:      Appearance: Normal appearance.  Eyes:     General: No scleral icterus. Musculoskeletal:     Cervical back: Neck supple.  Neurological:     Mental Status: He is alert.  Psychiatric:        Mood and Affect: Mood normal.        Behavior: Behavior normal.     ------------------------------------------------------------------------------------------------------------------------------------------------------------------------------------------------------------------- Assessment and Plan  Neuropathy Neuropathy has remained about the same.  He does noticed this a little more when drinking  alcohol.  Checking thiamine levels in addition to SPEP.  We did discuss adding alpha lipoic acid supplement.  He will pick this up and try it.  Routine screening for STI (sexually transmitted infection) STI screening per lab orders.   Meds ordered this encounter  Medications   metFORMIN (GLUCOPHAGE-XR) 500 MG 24 hr tablet    Sig: TAKE 1 TABLET BY MOUTH EVERY DAY WITH BREAKFAST    Dispense:  90 tablet    Refill:  3    Return in about 6 months (around 06/11/2022) for Neuropathy.    This visit occurred during the SARS-CoV-2 public health emergency.  Safety protocols were in place, including screening questions prior to the visit, additional usage of staff PPE, and extensive cleaning of exam room while observing appropriate contact time as indicated for disinfecting solutions.

## 2021-12-16 LAB — VITAMIN B1: Vitamin B1 (Thiamine): 19 nmol/L (ref 8–30)

## 2021-12-16 LAB — PROTEIN ELECTROPHORESIS, SERUM
Albumin ELP: 4.2 g/dL (ref 3.8–4.8)
Alpha 1: 0.2 g/dL (ref 0.2–0.3)
Alpha 2: 0.5 g/dL (ref 0.5–0.9)
Beta 2: 0.3 g/dL (ref 0.2–0.5)
Beta Globulin: 0.4 g/dL (ref 0.4–0.6)
Gamma Globulin: 1.1 g/dL (ref 0.8–1.7)
Total Protein: 6.8 g/dL (ref 6.1–8.1)

## 2021-12-16 LAB — HEMOGLOBIN A1C
Hgb A1c MFr Bld: 5.7 % of total Hgb — ABNORMAL HIGH (ref <5.7)
Mean Plasma Glucose: 117 mg/dL
eAG (mmol/L): 6.5 mmol/L

## 2021-12-16 LAB — RPR: RPR Ser Ql: NONREACTIVE

## 2021-12-16 LAB — TRICHOMONAS VAGINALIS, PROBE AMP: Trichomonas vaginalis RNA: NOT DETECTED

## 2021-12-16 LAB — CHLAMYDIA/NEISSERIA GONORRHOEAE RNA,TMA,UROGENTIAL
C. trachomatis RNA, TMA: NOT DETECTED
N. gonorrhoeae RNA, TMA: NOT DETECTED

## 2021-12-16 LAB — HEPATITIS C ANTIBODY: Hepatitis C Ab: NONREACTIVE

## 2021-12-16 LAB — HIV ANTIBODY (ROUTINE TESTING W REFLEX): HIV 1&2 Ab, 4th Generation: NONREACTIVE

## 2022-01-20 ENCOUNTER — Encounter: Payer: Self-pay | Admitting: Family Medicine

## 2022-01-20 DIAGNOSIS — F5102 Adjustment insomnia: Secondary | ICD-10-CM

## 2022-01-21 MED ORDER — TADALAFIL 20 MG PO TABS
ORAL_TABLET | ORAL | 1 refills | Status: DC
Start: 2022-01-21 — End: 2022-08-28

## 2022-01-21 MED ORDER — ZOLPIDEM TARTRATE 5 MG PO TABS: 5.0000 mg | ORAL_TABLET | Freq: Every evening | ORAL | 3 refills | Status: DC | PRN

## 2022-01-21 MED ORDER — DUTASTERIDE 0.5 MG PO CAPS: 0.5000 mg | ORAL_CAPSULE | Freq: Every day | ORAL | 2 refills | Status: DC

## 2022-01-21 MED ORDER — MINOXIDIL 2.5 MG PO TABS: 2.5000 mg | ORAL_TABLET | Freq: Every day | ORAL | 1 refills | Status: DC

## 2022-01-21 MED ORDER — METFORMIN HCL ER 500 MG PO TB24: ORAL_TABLET | ORAL | 3 refills | Status: DC

## 2022-01-21 MED ORDER — CLONAZEPAM 1 MG PO TABS: 1.0000 mg | ORAL_TABLET | Freq: Every day | ORAL | 4 refills | Status: DC

## 2022-01-21 MED ORDER — AZELAIC ACID 15 % EX GEL: CUTANEOUS | 12 refills | Status: DC

## 2022-01-23 ENCOUNTER — Telehealth: Payer: Self-pay

## 2022-01-23 ENCOUNTER — Other Ambulatory Visit: Payer: Self-pay | Admitting: Family Medicine

## 2022-01-23 NOTE — Telephone Encounter (Addendum)
Initiated Prior authorization for:Tadalafil '20MG'$  tablets Via: Covermymeds Case/Key:[id number]  Status: approved  as of 01/23/22 Reason:Coverage Start Date:12/24/2021;Coverage End Date:01/23/2023 Notified Pt via: Mychart

## 2022-03-10 ENCOUNTER — Encounter: Payer: Self-pay | Admitting: Family Medicine

## 2022-04-07 ENCOUNTER — Encounter (INDEPENDENT_AMBULATORY_CARE_PROVIDER_SITE_OTHER): Payer: 59 | Admitting: Family Medicine

## 2022-04-07 DIAGNOSIS — R509 Fever, unspecified: Secondary | ICD-10-CM

## 2022-04-07 DIAGNOSIS — Z20828 Contact with and (suspected) exposure to other viral communicable diseases: Secondary | ICD-10-CM

## 2022-04-08 MED ORDER — OSELTAMIVIR PHOSPHATE 75 MG PO CAPS: 75.0000 mg | ORAL_CAPSULE | Freq: Two times a day (BID) | ORAL | 0 refills | Status: DC

## 2022-04-08 NOTE — Telephone Encounter (Signed)

## 2022-05-20 ENCOUNTER — Other Ambulatory Visit: Payer: Self-pay

## 2022-05-20 MED ORDER — DUTASTERIDE 0.5 MG PO CAPS: 0.5000 mg | ORAL_CAPSULE | Freq: Every day | ORAL | 2 refills | Status: DC

## 2022-06-12 ENCOUNTER — Ambulatory Visit: Payer: 59 | Admitting: Family Medicine

## 2022-08-06 ENCOUNTER — Other Ambulatory Visit: Payer: Self-pay

## 2022-08-06 MED ORDER — MINOXIDIL 2.5 MG PO TABS: 2.5000 mg | ORAL_TABLET | Freq: Every day | ORAL | 1 refills | Status: DC

## 2022-08-19 ENCOUNTER — Other Ambulatory Visit: Payer: Self-pay

## 2022-08-19 DIAGNOSIS — F5102 Adjustment insomnia: Secondary | ICD-10-CM

## 2022-08-19 NOTE — Progress Notes (Signed)
Declined, needs appt.  Do not use same day/virtual slots.

## 2022-08-28 ENCOUNTER — Other Ambulatory Visit: Payer: Self-pay

## 2022-08-28 MED ORDER — TADALAFIL 20 MG PO TABS: ORAL_TABLET | ORAL | 1 refills | Status: DC

## 2022-09-17 ENCOUNTER — Other Ambulatory Visit: Payer: Self-pay

## 2022-09-17 MED ORDER — DUTASTERIDE 0.5 MG PO CAPS: 0.5000 mg | ORAL_CAPSULE | Freq: Every day | ORAL | 2 refills | Status: DC

## 2022-10-20 ENCOUNTER — Other Ambulatory Visit: Payer: Self-pay

## 2022-10-26 ENCOUNTER — Encounter: Payer: Self-pay | Admitting: Family Medicine

## 2022-10-26 ENCOUNTER — Ambulatory Visit (INDEPENDENT_AMBULATORY_CARE_PROVIDER_SITE_OTHER): Payer: No Typology Code available for payment source | Admitting: Family Medicine

## 2022-10-26 VITALS — BP 106/71 | HR 62 | Ht 76.0 in | Wt 197.0 lb

## 2022-10-26 DIAGNOSIS — G629 Polyneuropathy, unspecified: Secondary | ICD-10-CM

## 2022-10-26 DIAGNOSIS — E782 Mixed hyperlipidemia: Secondary | ICD-10-CM

## 2022-10-26 DIAGNOSIS — F5102 Adjustment insomnia: Secondary | ICD-10-CM

## 2022-10-26 DIAGNOSIS — N401 Enlarged prostate with lower urinary tract symptoms: Secondary | ICD-10-CM

## 2022-10-26 DIAGNOSIS — F411 Generalized anxiety disorder: Secondary | ICD-10-CM

## 2022-10-26 DIAGNOSIS — R7303 Prediabetes: Secondary | ICD-10-CM | POA: Insufficient documentation

## 2022-10-26 LAB — CBC WITH DIFFERENTIAL/PLATELET
Absolute Monocytes: 470 cells/uL (ref 200–950)
Basophils Absolute: 50 cells/uL (ref 0–200)
Basophils Relative: 1 %
Hemoglobin: 14.4 g/dL (ref 13.2–17.1)
Lymphs Abs: 1690 cells/uL (ref 850–3900)
MPV: 10.5 fL (ref 7.5–12.5)
Monocytes Relative: 9.4 %
Neutro Abs: 2670 cells/uL (ref 1500–7800)
Platelets: 232 10*3/uL (ref 140–400)
RBC: 4.74 10*6/uL (ref 4.20–5.80)
Total Lymphocyte: 33.8 %
WBC: 5 10*3/uL (ref 3.8–10.8)

## 2022-10-26 MED ORDER — DUTASTERIDE 0.5 MG PO CAPS: 0.5000 mg | ORAL_CAPSULE | Freq: Every day | ORAL | 2 refills | Status: DC

## 2022-10-26 MED ORDER — TADALAFIL 20 MG PO TABS: ORAL_TABLET | ORAL | 1 refills | Status: DC

## 2022-10-26 MED ORDER — AZELAIC ACID 15 % EX GEL: CUTANEOUS | 12 refills | Status: DC

## 2022-10-26 MED ORDER — CLONAZEPAM 1 MG PO TABS: 1.0000 mg | ORAL_TABLET | Freq: Every day | ORAL | 4 refills | Status: DC

## 2022-10-26 MED ORDER — MINOXIDIL 2.5 MG PO TABS: 2.5000 mg | ORAL_TABLET | Freq: Every day | ORAL | 1 refills | Status: DC

## 2022-10-26 MED ORDER — ZOLPIDEM TARTRATE 5 MG PO TABS
5.0000 mg | ORAL_TABLET | Freq: Every evening | ORAL | 3 refills | Status: DC | PRN
Start: 2022-10-26 — End: 2023-05-13

## 2022-10-26 NOTE — Assessment & Plan Note (Signed)
Continues on dutasteride to help with BPH symptoms as well as hair loss.

## 2022-10-26 NOTE — Progress Notes (Signed)
Darrell Contreras - 62 y.o. male MRN [id number]  Date of birth: 1960-12-11  Subjective Chief Complaint  Patient presents with  . Medication Refill    HPI Darrell Contreras is a 62 y.o. male here today for follow up visit.   Continues to have neuropathy.  He had lab work up for this previously which did not show any cause.  He has not had NCV/EMG.   He continues on metformin for prediabetes.  Previous A1c of 5.7%.  Tolerating metformin well at current strength.  Remains on clonazepam for anxiety with ambien at night as needed for sleep.  This is working pretty well for him.  Continues to report significant stress related to children's custody and his prior divorce a few years ago.Marland Kitchen  He denies side effects related to this.    Remains on minoxidil and dutasteride to help with hair growth.  Stable symptoms at this time.  ROS:  A comprehensive ROS was completed and negative except as noted per HPI  No Known Allergies  Past Medical History:  Diagnosis Date  . Abnormal ECG during exercise stress test 11/09/2016  . Abnormal stress ECG    a. 08/2016: stress test showing 2 mm horizontal ST depression in the inferior and lateral leads with occasional PVC's, overall findings suggestive of ischemia but possible repol abnormality.  . Prostatitis   . Rosacea   . Seizures (HCC)    states had seizure when IV was put in 15 years ago- pt states this isn't accurate    Past Surgical History:  Procedure Laterality Date  . COLONOSCOPY    . NASAL SEPTUM SURGERY    . WISDOM TOOTH EXTRACTION      Social History   Socioeconomic History  . Marital status: Divorced    Spouse name: Not on file  . Number of children: 5  . Years of education: Not on file  . Highest education level: Not on file  Occupational History    Comment: Software  Tobacco Use  . Smoking status: Never  . Smokeless tobacco: Never  Vaping Use  . Vaping status: Never Used  Substance and Sexual Activity  . Alcohol use: Yes     Alcohol/week: 0.0 standard drinks of alcohol    Comment: occasional  . Drug use: No  . Sexual activity: Not on file  Other Topics Concern  . Not on file  Social History Narrative  . Not on file   Social Determinants of Health   Financial Resource Strain: Not on file  Food Insecurity: No Food Insecurity (06/14/2020)   Received from [facility]   Hunger Vital Sign   . Worried About Programme researcher, broadcasting/film/video in the Last Year: Never true   . Ran Out of Food in the Last Year: Never true  Transportation Needs: Not on file  Physical Activity: Insufficiently Active (09/30/2017)   Exercise Vital Sign   . Days of Exercise per Week: 2 days   . Minutes of Exercise per Session: 10 min  Stress: Not on file  Social Connections: Unknown (08/22/2021)   Received from [facility]   Social Network   . Social Network: Not on file    Family History  Problem Relation Age of Onset  . Cancer Mother 56       breast cancer  . Cancer Father 32       skin cancer  . Aneurysm Father   . Colon cancer Neg Hx   . Esophageal cancer Neg Hx   . Rectal  cancer Neg Hx   . Stomach cancer Neg Hx     Health Maintenance  Topic Date Due  . COVID-19 Vaccine (7 - 2023-24 season) 12/12/2021  . INFLUENZA VACCINE  11/12/2022  . Colonoscopy  08/14/2024  . DTaP/Tdap/Td (4 - Td or Tdap) 01/18/2029  . Hepatitis C Screening  Completed  . HIV Screening  Completed  . Zoster Vaccines- Shingrix  Completed  . HPV VACCINES  Aged Out     ----------------------------------------------------------------------------------------------------------------------------------------------------------------------------------------------------------------- Physical Exam BP 106/71 (BP Location: Left Arm, Patient Position: Sitting, Cuff Size: Normal)   Pulse 62   Ht 6\' 4"  (1.93 m)   Wt 197 lb (89.4 kg)   SpO2 97%   BMI 23.98 kg/m   Physical Exam Constitutional:      Appearance: Normal appearance.  HENT:     Head: Normocephalic  and atraumatic.  Eyes:     General: No scleral icterus. Cardiovascular:     Rate and Rhythm: Normal rate.  Musculoskeletal:     Cervical back: Neck supple.  Neurological:     Mental Status: He is alert.  Psychiatric:        Mood and Affect: Mood normal.        Behavior: Behavior normal.    ------------------------------------------------------------------------------------------------------------------------------------------------------------------------------------------------------------------- Assessment and Plan  Neuropathy Referral to neurology to consider nerve conduction/EMG.  BPH (benign prostatic hyperplasia) Continues on dutasteride to help with BPH symptoms as well as hair loss.  GAD (generalized anxiety disorder) He will continue on clonazepam as needed for now however recommend that he try to wean back on use of this with goal of coming off of this completely at some point.  HLD (hyperlipidemia) Updated lipid panel ordered.  Prediabetes Update A1c.  Tolerating metformin well.   Meds ordered this encounter  Medications  . clonazePAM (KLONOPIN) 1 MG tablet    Sig: Take 1 tablet (1 mg total) by mouth at bedtime.    Dispense:  30 tablet    Refill:  4  . dutasteride (AVODART) 0.5 MG capsule    Sig: Take 1 capsule (0.5 mg total) by mouth daily.    Dispense:  90 capsule    Refill:  2  . minoxidil (LONITEN) 2.5 MG tablet    Sig: Take 1 tablet (2.5 mg total) by mouth daily.    Dispense:  90 tablet    Refill:  1  . DISCONTD: tadalafil (CIALIS) 20 MG tablet    Sig: TAKE 1/2 TO 1 TABLET (10-20MG  TOTAL) BY MOUTH EVERY OTHER DAY AS NEEDED FOR ERECTILE DYSFUNCTION    Dispense:  45 tablet    Refill:  1  . zolpidem (AMBIEN) 5 MG tablet    Sig: Take 1 tablet (5 mg total) by mouth at bedtime as needed. for sleep    Dispense:  30 tablet    Refill:  3    This request is for a new prescription for a controlled substance as required by Federal/State law.  . Azelaic Acid  (FINACEA) 15 % gel    Sig: After skin is thoroughly washed and patted dry, gently but thoroughly massage a thin film of azelaic acid gel into the affected area twice daily, in the morning and evening.    Dispense:  50 g    Refill:  12    FILL GEL    Return in about 6 months (around 04/28/2023) for Anxiety.    This visit occurred during the SARS-CoV-2 public health emergency.  Safety protocols were in place, including screening questions prior to  the visit, additional usage of staff PPE, and extensive cleaning of exam room while observing appropriate contact time as indicated for disinfecting solutions.

## 2022-10-26 NOTE — Assessment & Plan Note (Signed)
Updated lipid panel ordered. 

## 2022-10-26 NOTE — Assessment & Plan Note (Signed)
He will continue on clonazepam as needed for now however recommend that he try to wean back on use of this with goal of coming off of this completely at some point.

## 2022-10-26 NOTE — Assessment & Plan Note (Signed)
Update A1c.  Tolerating metformin well.

## 2022-10-26 NOTE — Assessment & Plan Note (Signed)
Referral to neurology to consider nerve conduction/EMG.

## 2022-10-27 LAB — COMPLETE METABOLIC PANEL WITH GFR
AG Ratio: 1.7 (calc) (ref 1.0–2.5)
ALT: 42 U/L (ref 9–46)
AST: 45 U/L — ABNORMAL HIGH (ref 10–35)
Albumin: 4.5 g/dL (ref 3.6–5.1)
Alkaline phosphatase (APISO): 59 U/L (ref 35–144)
BUN: 17 mg/dL (ref 7–25)
CO2: 23 mmol/L (ref 20–32)
Calcium: 10 mg/dL (ref 8.6–10.3)
Chloride: 106 mmol/L (ref 98–110)
Creat: 0.95 mg/dL (ref 0.70–1.35)
Globulin: 2.6 g/dL (calc) (ref 1.9–3.7)
Glucose, Bld: 97 mg/dL (ref 65–99)
Potassium: 4.3 mmol/L (ref 3.5–5.3)
Sodium: 138 mmol/L (ref 135–146)
Total Bilirubin: 0.7 mg/dL (ref 0.2–1.2)
Total Protein: 7.1 g/dL (ref 6.1–8.1)
eGFR: 90 mL/min/{1.73_m2} (ref >=60)

## 2022-10-27 LAB — CBC WITH DIFFERENTIAL/PLATELET
Eosinophils Absolute: 120 cells/uL (ref 15–500)
Eosinophils Relative: 2.4 %
HCT: 43.2 % (ref 38.5–50.0)
MCH: 30.4 pg (ref 27.0–33.0)
MCHC: 33.3 g/dL (ref 32.0–36.0)
MCV: 91.1 fL (ref 80.0–100.0)
Neutrophils Relative %: 53.4 %
RDW: 12.9 % (ref 11.0–15.0)

## 2022-10-27 LAB — LIPID PANEL W/REFLEX DIRECT LDL
Cholesterol: 148 mg/dL (ref <200)
HDL: 65 mg/dL (ref >=40)
LDL Cholesterol (Calc): 68 mg/dL (calc)
Non-HDL Cholesterol (Calc): 83 mg/dL (calc) (ref <130)
Total CHOL/HDL Ratio: 2.3 (calc) (ref <5.0)
Triglycerides: 72 mg/dL (ref <150)

## 2022-10-27 LAB — PSA: PSA: 0.07 ng/mL (ref <=4.00)

## 2022-10-27 LAB — HEMOGLOBIN A1C
Hgb A1c MFr Bld: 5.8 % of total Hgb — ABNORMAL HIGH (ref <5.7)
Mean Plasma Glucose: 120 mg/dL
eAG (mmol/L): 6.6 mmol/L

## 2022-11-02 ENCOUNTER — Encounter: Payer: Self-pay | Admitting: Family Medicine

## 2022-11-30 ENCOUNTER — Telehealth: Payer: Self-pay | Admitting: General Practice

## 2022-11-30 NOTE — Transitions of Care (Post Inpatient/ED Visit) (Signed)
   11/30/2022  Name: Darrell Contreras MRN: [id number] DOB: [birth year]  Today's TOC FU Call Status: Today's TOC FU Call Status:: Unsuccessful Call (1st Attempt) Unsuccessful Call (1st Attempt) Date: 11/30/22  Attempted to reach the patient regarding the most recent Inpatient/ED visit.  Follow Up Plan: Additional outreach attempts will be made to reach the patient to complete the Transitions of Care (Post Inpatient/ED visit) call.   Signature Modesto Charon, Control and instrumentation engineer

## 2022-12-01 NOTE — Transitions of Care (Post Inpatient/ED Visit) (Signed)
   12/01/2022  Name: Darrell Contreras MRN: [id number] DOB: 1960-06-11  Today's TOC FU Call Status: Today's TOC FU Call Status:: Unsuccessful Call (2nd Attempt) Unsuccessful Call (1st Attempt) Date: 11/30/22 Unsuccessful Call (2nd Attempt) Date: 12/01/22  Attempted to reach the patient regarding the most recent Inpatient/ED visit.  Follow Up Plan: Additional outreach attempts will be made to reach the patient to complete the Transitions of Care (Post Inpatient/ED visit) call.   Signature Modesto Charon, Control and instrumentation engineer

## 2022-12-02 NOTE — Transitions of Care (Post Inpatient/ED Visit) (Signed)
   12/02/2022  Name: Darrell Contreras MRN: [id number] DOB: 04/23/1960  Today's TOC FU Call Status: Today's TOC FU Call Status:: Unsuccessful Call (3rd Attempt) Unsuccessful Call (1st Attempt) Date: 11/30/22 Unsuccessful Call (2nd Attempt) Date: 12/01/22 Unsuccessful Call (3rd Attempt) Date: 12/02/22  Attempted to reach the patient regarding the most recent Inpatient/ED visit.  Follow Up Plan: No further outreach attempts will be made at this time. We have been unable to contact the patient.  Signature Modesto Charon, Control and instrumentation engineer

## 2022-12-07 ENCOUNTER — Encounter: Payer: Self-pay | Admitting: Family Medicine

## 2022-12-09 MED ORDER — AZELAIC ACID 15 % EX GEL: CUTANEOUS | 12 refills | Status: DC

## 2022-12-10 ENCOUNTER — Other Ambulatory Visit: Payer: Self-pay

## 2022-12-23 ENCOUNTER — Telehealth: Payer: Self-pay | Admitting: General Practice

## 2022-12-23 NOTE — Transitions of Care (Post Inpatient/ED Visit) (Signed)
   12/23/2022  Name: Darrell Contreras MRN: [id number] DOB: 10/31/1960  Today's TOC FU Call Status: Today's TOC FU Call Status:: Unsuccessful Call (1st Attempt) Unsuccessful Call (1st Attempt) Date: 12/23/22  Attempted to reach the patient regarding the most recent Inpatient/ED visit.  Follow Up Plan: Additional outreach attempts will be made to reach the patient to complete the Transitions of Care (Post Inpatient/ED visit) call.   Signature Modesto Charon, Control and instrumentation engineer

## 2022-12-28 NOTE — Transitions of Care (Post Inpatient/ED Visit) (Signed)
12/28/2022  Name: Darrell Contreras MRN: [id number] DOB: 1960/12/21  Today's TOC FU Call Status: Today's TOC FU Call Status:: Unsuccessful Call (2nd Attempt) Unsuccessful Call (1st Attempt) Date: 12/23/22 Unsuccessful Call (2nd Attempt) Date: 12/28/22  Attempted to reach the patient regarding the most recent Inpatient/ED visit.  Follow Up Plan: Additional outreach attempts will be made to reach the patient to complete the Transitions of Care (Post Inpatient/ED visit) call.   Signature Modesto Charon, Control and instrumentation engineer

## 2022-12-29 ENCOUNTER — Encounter: Payer: Self-pay | Admitting: Family Medicine

## 2022-12-29 ENCOUNTER — Ambulatory Visit (INDEPENDENT_AMBULATORY_CARE_PROVIDER_SITE_OTHER): Payer: No Typology Code available for payment source | Admitting: Family Medicine

## 2022-12-29 ENCOUNTER — Ambulatory Visit: Payer: No Typology Code available for payment source

## 2022-12-29 VITALS — BP 131/77 | HR 61 | Ht 76.0 in | Wt 201.0 lb

## 2022-12-29 DIAGNOSIS — S8011XD Contusion of right lower leg, subsequent encounter: Secondary | ICD-10-CM | POA: Diagnosis not present

## 2022-12-29 DIAGNOSIS — T148XXA Other injury of unspecified body region, initial encounter: Secondary | ICD-10-CM | POA: Insufficient documentation

## 2022-12-29 NOTE — Progress Notes (Signed)
Darrell Contreras - 62 y.o. male MRN [id number]  Date of birth: 1961-03-24  Subjective Chief Complaint  Patient presents with   Wound Check   Edema    HPI Darrell Contreras is a 62 y.o. male here today with complaint of swelling in the R leg.  He had injury of his leg about 1 month ago when he hit his leg on a hard object and cut his leg on a metal fence.  He had immediate swelling after the injury.    Seen in the ED due to concern for "internal bleeding" because of the recurrent swelling.  His swelling is not painful and improves with elevation and/or overnight.  [place] offered in the ED but was declined by patient.  Today reports continued swelling that is worse when on his feet for longer periods of time.  Denies chest pain, shortness of breath, palpitations.   ROS:  A comprehensive ROS was completed and negative except as noted per HPI  No Known Allergies  Past Medical History:  Diagnosis Date   Abnormal ECG during exercise stress test 11/09/2016   Abnormal stress ECG    a. 08/2016: stress test showing 2 mm horizontal ST depression in the inferior and lateral leads with occasional PVC's, overall findings suggestive of ischemia but possible repol abnormality.   Prostatitis    Rosacea    Seizures (HCC)    states had seizure when IV was put in 15 years ago- pt states this isn't accurate    Past Surgical History:  Procedure Laterality Date   COLONOSCOPY     NASAL SEPTUM SURGERY     WISDOM TOOTH EXTRACTION      Social History   Socioeconomic History   Marital status: Divorced    Spouse name: Not on file   Number of children: 5   Years of education: Not on file   Highest education level: Not on file  Occupational History    Comment: Software  Tobacco Use   Smoking status: Never   Smokeless tobacco: Never  Vaping Use   Vaping status: Never Used  Substance and Sexual Activity   Alcohol use: Yes    Alcohol/week: 0.0 standard drinks of alcohol    Comment: occasional   Drug use: No    Sexual activity: Not on file  Other Topics Concern   Not on file  Social History Narrative   Not on file   Social Determinants of Health   Financial Resource Strain: Not on file  Food Insecurity: No Food Insecurity (06/14/2020)   Received from [facility]   Hunger Vital Sign    Worried About Running Out of Food in the Last Year: Never true    Ran Out of Food in the Last Year: Never true  Transportation Needs: Not on file  Physical Activity: Insufficiently Active (09/30/2017)   Exercise Vital Sign    Days of Exercise per Week: 2 days    Minutes of Exercise per Session: 10 min  Stress: Not on file  Social Connections: Unknown (08/22/2021)   Received from [facility]   Social Network    Social Network: Not on file    Family History  Problem Relation Age of Onset   Cancer Mother 59       breast cancer   Cancer Father 40       skin cancer   Aneurysm Father    Colon cancer Neg Hx    Esophageal cancer Neg Hx    Rectal  cancer Neg Hx    Stomach cancer Neg Hx     Health Maintenance  Topic Date Due   COVID-19 Vaccine (7 - 2023-24 season) 02/14/2023 (Originally 12/13/2022)   INFLUENZA VACCINE  07/12/2023 (Originally 11/12/2022)   Colonoscopy  08/14/2024   DTaP/Tdap/Td (4 - Td or Tdap) 01/18/2029   Hepatitis C Screening  Completed   HIV Screening  Completed   Zoster Vaccines- Shingrix  Completed   HPV VACCINES  Aged Out     ----------------------------------------------------------------------------------------------------------------------------------------------------------------------------------------------------------------- Physical Exam BP 131/77 (BP Location: Left Arm, Patient Position: Sitting, Cuff Size: Normal)   Pulse 61   Ht 6\' 4"  (1.93 m)   Wt 201 lb (91.2 kg)   SpO2 100%   BMI 24.47 kg/m   Physical Exam Constitutional:      Appearance: Normal appearance.  HENT:     Head: Normocephalic and atraumatic.  Skin:    Comments:  Hematoma to RLE.  Non-tender.  No ulceration   Neurological:     Mental Status: He is alert.  Psychiatric:        Mood and Affect: Mood normal.        Behavior: Behavior normal.     ------------------------------------------------------------------------------------------------------------------------------------------------------------------------------------------------------------------- Assessment and Plan  Hematoma He feels like area has gotten larger.  Stat US soft tissue of LE ordered.  Recommend compression stockings.    No orders of the defined types were placed in this encounter.   No follow-ups on file.    This visit occurred during the SARS-CoV-2 public health emergency.  Safety protocols were in place, including screening questions prior to the visit, additional usage of staff PPE, and extensive cleaning of exam room while observing appropriate contact time as indicated for disinfecting solutions.

## 2022-12-29 NOTE — Assessment & Plan Note (Signed)
He feels like area has gotten larger.  Stat US soft tissue of LE ordered.  Recommend compression stockings.

## 2022-12-29 NOTE — Transitions of Care (Post Inpatient/ED Visit) (Signed)
12/29/2022  Name: Darrell Contreras MRN: [id number] DOB: 08-May-1960  Today's TOC FU Call Status: Today's TOC FU Call Status:: Unsuccessful Call (3rd Attempt) Unsuccessful Call (1st Attempt) Date: 12/23/22 Unsuccessful Call (2nd Attempt) Date: 12/28/22 Unsuccessful Call (3rd Attempt) Date: 12/29/22  Attempted to reach the patient regarding the most recent Inpatient/ED visit.  Follow Up Plan: No further outreach attempts will be made at this time. We have been unable to contact the patient.  Signature Modesto Charon, Control and instrumentation engineer

## 2023-01-20 DIAGNOSIS — N201 Calculus of ureter: Secondary | ICD-10-CM | POA: Insufficient documentation

## 2023-02-04 ENCOUNTER — Ambulatory Visit (INDEPENDENT_AMBULATORY_CARE_PROVIDER_SITE_OTHER): Payer: No Typology Code available for payment source | Admitting: Neurology

## 2023-02-04 ENCOUNTER — Encounter: Payer: Self-pay | Admitting: Neurology

## 2023-02-04 VITALS — BP 139/90 | HR 69 | Ht 76.0 in | Wt 214.4 lb

## 2023-02-04 DIAGNOSIS — R202 Paresthesia of skin: Secondary | ICD-10-CM

## 2023-02-04 MED ORDER — DULOXETINE HCL 60 MG PO CPEP
60.0000 mg | ORAL_CAPSULE | Freq: Every day | ORAL | 11 refills | Status: DC
Start: 2023-02-04 — End: 2023-05-13

## 2023-02-04 NOTE — Progress Notes (Signed)
Chief Complaint  Patient presents with   Follow-up    Rm 14. Patient alone, Tingling pain in both feet, constant pain and no otc will help. Used to work out quite frequently. No numbness in hands, not a diabetic and reports one alcohol drink maybe once a week or every other week. Been going on for over three years now.       ASSESSMENT AND PLAN  Darrell Contreras is a 62 y.o. male   Bilateral lower extremity paresthesia Excessive stress, anxiety  Suggestive of small fiber neuropathy  Laboratory evaluations to rule out treatable etiology  Cymbalta 60 mg for symptomatic control  Will inform him lab result, follow-up plan depend on workup result  DIAGNOSTIC DATA (LABS, IMAGING, TESTING) - I reviewed patient records, labs, notes, testing and imaging myself where available.   MEDICAL HISTORY:  Darrell Contreras is a 62 year old male, seen in request by his primary care physician Dr. Ashley Royalty, Selena Batten, for evaluation of bilateral feet paresthesia initial evaluation February 04, 2023   History is obtained from the patient and review of electronic medical records. I personally reviewed pertinent available imaging films in PACS.   PMHx of  Chronic insomnia Anxiety Kidney stone,  He reported excessive stress over the past few years, separating eventually divorce his wife, is a single father of 2 young children, around 2021, he reported bilateral feet numbness tingling, mainly at the bottom of his feet, worsening needle pricking sensation with excessive alcohol use, now he only drinks alcohol occasionally, during the daytime with distraction, he feels bilateral feet paresthesia occasionally, at nighttime it most bothersome, bilateral feet feel cold, he denies gait abnormality denies significant low back pain bowel bladder incontinence,  PHYSICAL EXAM:   Vitals:   02/04/23 1547  BP: (!) 139/90  Pulse: 69  Weight: 214 lb 6.4 oz (97.3 kg)  Height: 6\' 4"  (1.93 m)   Body mass index is 26.1  kg/m.  PHYSICAL EXAMNIATION:  Gen: NAD, conversant, well nourised, well groomed                     Cardiovascular: Regular rate rhythm, no peripheral edema, warm, nontender. Eyes: Conjunctivae clear without exudates or hemorrhage Neck: Supple, no carotid bruits. Pulmonary: Clear to auscultation bilaterally   NEUROLOGICAL EXAM:  MENTAL STATUS: Speech/cognition: Awake, alert, oriented to history taking and casual conversation CRANIAL NERVES: CN II: Visual fields are full to confrontation. Pupils are round equal and briskly reactive to light. CN III, IV, VI: extraocular movement are normal. No ptosis. CN V: Facial sensation is intact to light touch CN VII: Face is symmetric with normal eye closure  CN VIII: Hearing is normal to causal conversation. CN IX, X: Phonation is normal. CN XI: Head turning and shoulder shrug are intact  MOTOR: There is no pronator drift of out-stretched arms. Muscle bulk and tone are normal. Muscle strength is normal.  REFLEXES: Reflexes are 2+ and symmetric at the biceps, triceps, knees, and trace ankles. Plantar responses are flexor.  SENSORY: Intact to light touch, pinprick and vibratory sensation are intact in fingers and toes.  COORDINATION: There is no trunk or limb dysmetria noted.  GAIT/STANCE: Posture is normal. Gait is steady with normal steps, base, arm swing, and turning. Heel and toe walking are normal. Tandem gait is normal.  Romberg is absent.  REVIEW OF SYSTEMS:  Full 14 system review of systems performed and notable only for as above All other review of systems were negative.   ALLERGIES: Allergies  Allergen Reactions   Latex Other (See Comments)    BAND-AID, ADHESIVE, CAUSE  SKIN WHELPS    HOME MEDICATIONS: Current Outpatient Medications  Medication Sig Dispense Refill   Azelaic Acid (FINACEA) 15 % gel After skin is thoroughly washed and patted dry, gently but thoroughly massage a thin film of azelaic acid gel into the  affected area twice daily, in the morning and evening. 60 g 12   clonazePAM (KLONOPIN) 1 MG tablet Take 1 tablet (1 mg total) by mouth at bedtime. 30 tablet 4   dutasteride (AVODART) 0.5 MG capsule Take 1 capsule (0.5 mg total) by mouth daily. 90 capsule 2   metFORMIN (GLUCOPHAGE-XR) 500 MG 24 hr tablet TAKE 1 TABLET BY MOUTH EVERY DAY WITH BREAKFAST 90 tablet 3   minoxidil (LONITEN) 2.5 MG tablet Take 1 tablet (2.5 mg total) by mouth daily. 90 tablet 1   ondansetron (ZOFRAN-ODT) 4 MG disintegrating tablet Take by mouth.     oxyCODONE-acetaminophen (PERCOCET/ROXICET) 5-325 MG tablet Take by mouth.     tamsulosin (FLOMAX) 0.4 MG CAPS capsule Take 0.4 mg by mouth daily.     valACYclovir (VALTREX) 1000 MG tablet Take 2 tablets (2,000 mg total) by mouth 2 (two) times daily. For coldsore at onset of symptoms 30 tablet 2   zolpidem (AMBIEN) 5 MG tablet Take 1 tablet (5 mg total) by mouth at bedtime as needed. for sleep 30 tablet 3   No current facility-administered medications for this visit.    PAST MEDICAL HISTORY: Past Medical History:  Diagnosis Date   Abnormal ECG during exercise stress test 11/09/2016   Abnormal stress ECG    a. 08/2016: stress test showing 2 mm horizontal ST depression in the inferior and lateral leads with occasional PVC's, overall findings suggestive of ischemia but possible repol abnormality.   Prostatitis    Rosacea    Seizures (HCC)    states had seizure when IV was put in 15 years ago- pt states this isn't accurate    PAST SURGICAL HISTORY: Past Surgical History:  Procedure Laterality Date   COLONOSCOPY     NASAL SEPTUM SURGERY     WISDOM TOOTH EXTRACTION      FAMILY HISTORY: Family History  Problem Relation Age of Onset   Cancer Mother 13       breast cancer   Cancer Father 60       skin cancer   Aneurysm Father    Colon cancer Neg Hx    Esophageal cancer Neg Hx    Rectal cancer Neg Hx    Stomach cancer Neg Hx     SOCIAL HISTORY: Social History    Socioeconomic History   Marital status: Divorced    Spouse name: Not on file   Number of children: 5   Years of education: Not on file   Highest education level: Not on file  Occupational History    Comment: Software  Tobacco Use   Smoking status: Never   Smokeless tobacco: Never  Vaping Use   Vaping status: Never Used  Substance and Sexual Activity   Alcohol use: Yes    Alcohol/week: 0.0 standard drinks of alcohol    Comment: occasional   Drug use: No   Sexual activity: Not on file  Other Topics Concern   Not on file  Social History Narrative   Not on file   Social Determinants of Health   Financial Resource Strain: Low Risk  (01/14/2023)   Received from [facility]   Overall  Financial Resource Strain (CARDIA)    Difficulty of Paying Living Expenses: Not very hard  Food Insecurity: No Food Insecurity (01/14/2023)   [facility]   Hunger Vital Sign    Worried About Running Out of Food in the Last Year: Never true    Ran Out of Food in the Last Year: Never true  Transportation Needs: No Transportation Needs (01/14/2023)   Received from [facility] - [facility]    Lack of Transportation (Medical): No    Lack of Transportation (Non-Medical): No  Physical Activity: Insufficiently Active (09/30/2017)   Exercise Vital Sign    Days of Exercise per Week: 2 days    Minutes of Exercise per Session: 10 min  Stress: Not on file  Social Connections: Unknown (08/22/2021)   Received from [facility], [facility]   Social Network    Social Network: Not on file  Intimate Partner Violence: Not At Risk (12/23/2022)   Received from [facility]   HITS    Over the last 12 months how often did your partner physically hurt you?: 1    Over the last 12 months how often did your partner insult you or talk down to you?: 1    Over the last 12 months how often did your partner threaten you with physical harm?: 1    Over the last 12 months how often did  your partner scream or curse at you?: 1      Levert Feinstein, M.D. Ph.D.  [facility] [address], [address] [place], [place] [address] Ph: [phone] Fax: [phone]  CC:  Everrett Coombe, DO [address] [place] [address],  [place] [address]  Everrett Coombe, DO

## 2023-02-13 LAB — MULTIPLE MYELOMA PANEL, SERUM
Albumin SerPl Elph-Mcnc: 4.2 g/dL (ref 2.9–4.4)
Albumin/Glob SerPl: 1.5 (ref 0.7–1.7)
Alpha 1: 0.2 g/dL (ref 0.0–0.4)
Alpha2 Glob SerPl Elph-Mcnc: 0.6 g/dL (ref 0.4–1.0)
B-Globulin SerPl Elph-Mcnc: 1 g/dL (ref 0.7–1.3)
Gamma Glob SerPl Elph-Mcnc: 1.2 g/dL (ref 0.4–1.8)
Globulin, Total: 3 g/dL (ref 2.2–3.9)
IgA/Immunoglobulin A, Serum: 202 mg/dL (ref 61–437)
IgG (Immunoglobin G), Serum: 1269 mg/dL (ref 603–1613)
IgM (Immunoglobulin M), Srm: 74 mg/dL (ref 20–172)
Total Protein: 7.2 g/dL (ref 6.0–8.5)

## 2023-02-13 LAB — RPR: RPR Ser Ql: NONREACTIVE

## 2023-02-13 LAB — FOLATE: Folate: 15.5 ng/mL (ref >3.0)

## 2023-02-13 LAB — SEDIMENTATION RATE: Sed Rate: 2 mm/h (ref 0–30)

## 2023-02-13 LAB — VITAMIN D 25 HYDROXY (VIT D DEFICIENCY, FRACTURES): Vit D, 25-Hydroxy: 38.5 ng/mL (ref 30.0–100.0)

## 2023-02-13 LAB — ANA W/REFLEX IF POSITIVE: Anti Nuclear Antibody (ANA): NEGATIVE

## 2023-02-13 LAB — CK: Total CK: 261 U/L (ref 41–331)

## 2023-02-13 LAB — TSH: TSH: 1.1 u[IU]/mL (ref 0.450–4.500)

## 2023-02-13 LAB — C-REACTIVE PROTEIN: CRP: <1 mg/L (ref 0–10)

## 2023-02-13 LAB — VITAMIN B12: Vitamin B-12: 879 pg/mL (ref 232–1245)

## 2023-02-17 ENCOUNTER — Ambulatory Visit (INDEPENDENT_AMBULATORY_CARE_PROVIDER_SITE_OTHER): Payer: No Typology Code available for payment source | Admitting: Family Medicine

## 2023-02-17 ENCOUNTER — Encounter: Payer: Self-pay | Admitting: Family Medicine

## 2023-02-17 VITALS — BP 120/77 | HR 76 | Ht 76.0 in | Wt 206.0 lb

## 2023-02-17 DIAGNOSIS — L03115 Cellulitis of right lower limb: Secondary | ICD-10-CM

## 2023-02-17 DIAGNOSIS — L0390 Cellulitis, unspecified: Secondary | ICD-10-CM | POA: Insufficient documentation

## 2023-02-17 MED ORDER — CEPHALEXIN 500 MG PO CAPS: 500.0000 mg | ORAL_CAPSULE | Freq: Four times a day (QID) | ORAL | 0 refills | Status: AC

## 2023-02-17 NOTE — Assessment & Plan Note (Signed)
Start cephalexin 500mg  qid x7 days.  Precautions and red flags reviewed.  Contact clinic if not improving.

## 2023-02-17 NOTE — Patient Instructions (Signed)

## 2023-02-17 NOTE — Progress Notes (Signed)
Darrell Contreras - 62 y.o. male MRN [id number]  Date of birth: 07/05/1960  Subjective Chief Complaint  Patient presents with   Follow-up    HPI Darrell Contreras is a 62 y.o. male here today for follow up of hematoma.  Seen about 1 month ago and had [place] completed at that time.  He noticed some redness over the past couple of days and area has felt warm to touch.  This has improved some today.  Denies fever, chills.  ROS:  A comprehensive ROS was completed and negative except as noted per HPI  Allergies  Allergen Reactions   Latex Other (See Comments)    BAND-AID, ADHESIVE, CAUSE  SKIN WHELPS    Past Medical History:  Diagnosis Date   Abnormal ECG during exercise stress test 11/09/2016   Abnormal stress ECG    a. 08/2016: stress test showing 2 mm horizontal ST depression in the inferior and lateral leads with occasional PVC's, overall findings suggestive of ischemia but possible repol abnormality.   Prostatitis    Rosacea    Seizures (HCC)    states had seizure when IV was put in 15 years ago- pt states this isn't accurate    Past Surgical History:  Procedure Laterality Date   COLONOSCOPY     NASAL SEPTUM SURGERY     WISDOM TOOTH EXTRACTION      Social History   Socioeconomic History   Marital status: Divorced    Spouse name: Not on file   Number of children: 5   Years of education: Not on file   Highest education level: Not on file  Occupational History    Comment: Software  Tobacco Use   Smoking status: Never   Smokeless tobacco: Never  Vaping Use   Vaping status: Never Used  Substance and Sexual Activity   Alcohol use: Yes    Alcohol/week: 0.0 standard drinks of alcohol    Comment: occasional   Drug use: No   Sexual activity: Not on file  Other Topics Concern   Not on file  Social History Narrative   Not on file   Social Determinants of Health   Financial Resource Strain: Low Risk  (01/14/2023)   Received from [facility]   Overall Financial Resource Strain  (CARDIA)    Difficulty of Paying Living Expenses: Not very hard  Food Insecurity: No Food Insecurity (01/14/2023)   [facility] from [facility]   Hunger Vital Sign    Worried About Running Out of Food in the Last Year: Never true    Ran Out of Food in the Last Year: Never true  Transportation Needs: No Transportation Needs (01/14/2023)   Received from [facility] - Transportation    Lack of Transportation (Medical): No    Lack of Transportation (Non-Medical): No  Physical Activity: Insufficiently Active (09/30/2017)   Exercise Vital Sign    Days of Exercise per Week: 2 days    Minutes of Exercise per Session: 10 min  Stress: Not on file  Social Connections: Unknown (08/22/2021)   Received from [facility]   Social Network    Social Network: Not on file    Family History  Problem Relation Age of Onset   Cancer Mother 90       breast cancer   Cancer Father 50       skin cancer   Aneurysm Father    Colon cancer Neg Hx    Esophageal cancer Neg Hx    Rectal  cancer Neg Hx    Stomach cancer Neg Hx     Health Maintenance  Topic Date Due   COVID-19 Vaccine (7 - 2023-24 season) 03/05/2023 (Originally 12/13/2022)   INFLUENZA VACCINE  07/12/2023 (Originally 11/12/2022)   Colonoscopy  08/14/2024   DTaP/Tdap/Td (4 - Td or Tdap) 01/18/2029   Hepatitis C Screening  Completed   HIV Screening  Completed   Zoster Vaccines- Shingrix  Completed   HPV VACCINES  Aged Out     ----------------------------------------------------------------------------------------------------------------------------------------------------------------------------------------------------------------- Physical Exam BP 120/77 (BP Location: Left Arm, Patient Position: Sitting, Cuff Size: Large)   Pulse 76   Ht 6\' 4"  (1.93 m)   Wt 206 lb (93.4 kg)   SpO2 99%   BMI 25.08 kg/m   Physical Exam Constitutional:      Appearance: Normal appearance.  Cardiovascular:     Pulses: Normal  pulses.  Skin:    Comments: Erythema and warmth over RLE.  Mild ttp.    Neurological:     Mental Status: He is alert.     ------------------------------------------------------------------------------------------------------------------------------------------------------------------------------------------------------------------- Assessment and Plan  Cellulitis Start cephalexin 500mg  qid x7 days.  Precautions and red flags reviewed.  Contact clinic if not improving.   Meds ordered this encounter  Medications   cephALEXin (KEFLEX) 500 MG capsule    Sig: Take 1 capsule (500 mg total) by mouth 4 (four) times daily for 7 days.    Dispense:  28 capsule    Refill:  0    No follow-ups on file.    This visit occurred during the SARS-CoV-2 public health emergency.  Safety protocols were in place, including screening questions prior to the visit, additional usage of staff PPE, and extensive cleaning of exam room while observing appropriate contact time as indicated for disinfecting solutions.

## 2023-02-23 ENCOUNTER — Encounter: Payer: Self-pay | Admitting: Family Medicine

## 2023-02-23 ENCOUNTER — Telehealth: Payer: Self-pay | Admitting: Family Medicine

## 2023-02-23 NOTE — Telephone Encounter (Signed)
Patient called he is almost out of his KEFLEX 500mg  he says his leg is still red he is asking if could be prescribed the next level of antibiotics for cellulitis  Please advise [phone]   [facility]  [place]  [phone]

## 2023-02-24 ENCOUNTER — Ambulatory Visit: Payer: No Typology Code available for payment source | Admitting: Family Medicine

## 2023-02-24 MED ORDER — DOXYCYCLINE HYCLATE 100 MG PO TABS: 100.0000 mg | ORAL_TABLET | Freq: Two times a day (BID) | ORAL | 0 refills | Status: DC

## 2023-03-03 ENCOUNTER — Ambulatory Visit (INDEPENDENT_AMBULATORY_CARE_PROVIDER_SITE_OTHER): Payer: No Typology Code available for payment source | Admitting: Family Medicine

## 2023-03-03 ENCOUNTER — Encounter: Payer: Self-pay | Admitting: Family Medicine

## 2023-03-03 VITALS — BP 122/76 | HR 73 | Ht 76.0 in | Wt 212.0 lb

## 2023-03-03 DIAGNOSIS — R7303 Prediabetes: Secondary | ICD-10-CM | POA: Diagnosis not present

## 2023-03-03 DIAGNOSIS — L03115 Cellulitis of right lower limb: Secondary | ICD-10-CM

## 2023-03-03 LAB — POCT GLYCOSYLATED HEMOGLOBIN (HGB A1C): HbA1c, POC (prediabetic range): 5.7 % (ref 5.7–6.4)

## 2023-03-03 NOTE — Progress Notes (Signed)
Darrell Contreras - 62 y.o. male MRN [id number]  Date of birth: 05-01-1960  Subjective Chief Complaint  Patient presents with   Weight Loss   Prediabetes    HPI Darrell Contreras is a 62 year old male here today for follow-up visit.  He has history of prediabetes that has been managed with metformin.  He is interested in possibly trying GLP-1 to help with glucose control.  Overall he is tolerating metformin well.  His BMI is normal.  His A1c has improved to a normal range today at 5.7%.  ROS:  A comprehensive ROS was completed and negative except as noted per HPI  Allergies  Allergen Reactions   Latex Other (See Comments)    BAND-AID, ADHESIVE, CAUSE  SKIN WHELPS    Past Medical History:  Diagnosis Date   Abnormal ECG during exercise stress test 11/09/2016   Abnormal stress ECG    a. 08/2016: stress test showing 2 mm horizontal ST depression in the inferior and lateral leads with occasional PVC's, overall findings suggestive of ischemia but possible repol abnormality.   Prostatitis    Rosacea    Seizures (HCC)    states had seizure when IV was put in 15 years ago- pt states this isn't accurate    Past Surgical History:  Procedure Laterality Date   COLONOSCOPY     NASAL SEPTUM SURGERY     WISDOM TOOTH EXTRACTION      Social History   Socioeconomic History   Marital status: Divorced    Spouse name: Not on file   Number of children: 5   Years of education: Not on file   Highest education level: Not on file  Occupational History    Comment: Software  Tobacco Use   Smoking status: Never   Smokeless tobacco: Never  Vaping Use   Vaping status: Never Used  Substance and Sexual Activity   Alcohol use: Yes    Alcohol/week: 0.0 standard drinks of alcohol    Comment: occasional   Drug use: No   Sexual activity: Not on file  Other Topics Concern   Not on file  Social History Narrative   Not on file   Social Determinants of Health   Financial Resource Strain: Low Risk   (01/14/2023)   Received from [facility]   Overall Financial Resource Strain (CARDIA)    Difficulty of Paying Living Expenses: Not very hard  Food Insecurity: No Food Insecurity (01/14/2023)   Received from [facility]   Hunger Vital Sign    Worried About Running Out of Food in the Last Year: Never true    Ran Out of Food in the Last Year: Never true  Transportation Needs: No Transportation Needs (01/14/2023)   Received from [facility] - Transportation    Lack of Transportation (Medical): No    Lack of Transportation (Non-Medical): No  Physical Activity: Insufficiently Active (09/30/2017)   Exercise Vital Sign    Days of Exercise per Week: 2 days    Minutes of Exercise per Session: 10 min  Stress: Not on file  Social Connections: Unknown (08/22/2021)   Received from [facility]   Social Network    Social Network: Not on file    Family History  Problem Relation Age of Onset   Cancer Mother 73       breast cancer   Cancer Father 59       skin cancer   Aneurysm Father    Colon cancer Neg Hx  Esophageal cancer Neg Hx    Rectal cancer Neg Hx    Stomach cancer Neg Hx     Health Maintenance  Topic Date Due   COVID-19 Vaccine (7 - 2023-24 season) 03/05/2023 (Originally 12/13/2022)   INFLUENZA VACCINE  07/12/2023 (Originally 11/12/2022)   Colonoscopy  08/14/2024   DTaP/Tdap/Td (4 - Td or Tdap) 01/18/2029   Hepatitis C Screening  Completed   HIV Screening  Completed   Zoster Vaccines- Shingrix  Completed   HPV VACCINES  Aged Out     ----------------------------------------------------------------------------------------------------------------------------------------------------------------------------------------------------------------- Physical Exam BP 122/76 (BP Location: Left Arm, Patient Position: Sitting, Cuff Size: Large)   Pulse 73   Ht 6\' 4"  (1.93 m)   Wt 212 lb (96.2 kg)   SpO2 97%   BMI 25.81 kg/m   Physical  Exam Constitutional:      Appearance: Normal appearance.  Eyes:     General: No scleral icterus. Musculoskeletal:     Cervical back: Neck supple.  Neurological:     Mental Status: He is alert.  Psychiatric:        Mood and Affect: Mood normal.        Behavior: Behavior normal.     ------------------------------------------------------------------------------------------------------------------------------------------------------------------------------------------------------------------- Assessment and Plan  Prediabetes Blood sugars are actually improved at this time.  I encouraged her to continue to work on dietary changes to help with management of his blood glucose.  Patient interested in GLP-1 in the future I discussed with him he would probably pay out-of-pocket.  Cellulitis Improved with doxycycline.   No orders of the defined types were placed in this encounter.   No follow-ups on file.    This visit occurred during the SARS-CoV-2 public health emergency.  Safety protocols were in place, including screening questions prior to the visit, additional usage of staff PPE, and extensive cleaning of exam room while observing appropriate contact time as indicated for disinfecting solutions.

## 2023-03-03 NOTE — Assessment & Plan Note (Signed)
Improved with doxycycline.

## 2023-03-03 NOTE — Assessment & Plan Note (Signed)
Blood sugars are actually improved at this time.  I encouraged her to continue to work on dietary changes to help with management of his blood glucose.  Patient interested in GLP-1 in the future I discussed with him he would probably pay out-of-pocket.

## 2023-05-13 ENCOUNTER — Ambulatory Visit (INDEPENDENT_AMBULATORY_CARE_PROVIDER_SITE_OTHER): Payer: No Typology Code available for payment source | Admitting: Family Medicine

## 2023-05-13 ENCOUNTER — Encounter: Payer: Self-pay | Admitting: Family Medicine

## 2023-05-13 VITALS — BP 131/79 | HR 74 | Temp 98.1°F | Ht 76.0 in | Wt 213.0 lb

## 2023-05-13 DIAGNOSIS — F5102 Adjustment insomnia: Secondary | ICD-10-CM | POA: Diagnosis not present

## 2023-05-13 DIAGNOSIS — R052 Subacute cough: Secondary | ICD-10-CM

## 2023-05-13 DIAGNOSIS — R059 Cough, unspecified: Secondary | ICD-10-CM | POA: Insufficient documentation

## 2023-05-13 MED ORDER — MINOXIDIL 2.5 MG PO TABS: 2.5000 mg | ORAL_TABLET | Freq: Every day | ORAL | 1 refills | Status: DC

## 2023-05-13 MED ORDER — PREDNISONE 20 MG PO TABS: 20.0000 mg | ORAL_TABLET | Freq: Two times a day (BID) | ORAL | 0 refills | Status: AC

## 2023-05-13 MED ORDER — METFORMIN HCL ER 500 MG PO TB24: ORAL_TABLET | ORAL | 3 refills | Status: DC

## 2023-05-13 MED ORDER — VALACYCLOVIR HCL 1 G PO TABS: 2000.0000 mg | ORAL_TABLET | Freq: Two times a day (BID) | ORAL | 2 refills | Status: DC

## 2023-05-13 MED ORDER — ZOLPIDEM TARTRATE 5 MG PO TABS: 5.0000 mg | ORAL_TABLET | Freq: Every evening | ORAL | 3 refills | Status: DC | PRN

## 2023-05-13 MED ORDER — DUTASTERIDE 0.5 MG PO CAPS: 0.5000 mg | ORAL_CAPSULE | Freq: Every day | ORAL | 2 refills | Status: DC

## 2023-05-13 MED ORDER — GUAIFENESIN-CODEINE 100-10 MG/5ML PO SOLN: 5.0000 mL | Freq: Four times a day (QID) | ORAL | 0 refills | Status: DC | PRN

## 2023-05-13 MED ORDER — AZITHROMYCIN 250 MG PO TABS: ORAL_TABLET | ORAL | 0 refills | Status: AC

## 2023-05-13 NOTE — Progress Notes (Signed)
Darrell Contreras - 63 y.o. male MRN [id number]  Date of birth: 1960/09/06  Subjective Chief Complaint  Patient presents with   Cough   URI    HPI Darrell Contreras is a 63 y.o. male here today with complaint of cough.  He has had cough for about 3 weeks.  Reports increased drainage which may be contributing to the cough.  He has not had fever, chills, headache or sinus pain.  He had URI a few weeks ago while traveling.  He is coughing to the point that he has vomited.  He is having difficulty sleeping at night.   ROS:  A comprehensive ROS was completed and negative except as noted per HPI  Allergies  Allergen Reactions   Latex Other (See Comments)    BAND-AID, ADHESIVE, CAUSE  SKIN WHELPS    Past Medical History:  Diagnosis Date   Abnormal ECG during exercise stress test 11/09/2016   Abnormal stress ECG    a. 08/2016: stress test showing 2 mm horizontal ST depression in the inferior and lateral leads with occasional PVC's, overall findings suggestive of ischemia but possible repol abnormality.   Prostatitis    Rosacea    Seizures (HCC)    states had seizure when IV was put in 15 years ago- pt states this isn't accurate    Past Surgical History:  Procedure Laterality Date   COLONOSCOPY     NASAL SEPTUM SURGERY     WISDOM TOOTH EXTRACTION      Social History   Socioeconomic History   Marital status: Divorced    Spouse name: Not on file   Number of children: 5   Years of education: Not on file   Highest education level: Not on file  Occupational History    Comment: Software  Tobacco Use   Smoking status: Never   Smokeless tobacco: Never  Vaping Use   Vaping status: Never Used  Substance and Sexual Activity   Alcohol use: Yes    Alcohol/week: 0.0 standard drinks of alcohol    Comment: occasional   Drug use: No   Sexual activity: Not on file  Other Topics Concern   Not on file  Social History Narrative   Not on file   Social Drivers of Health   Financial Resource  Strain: Low Risk  (01/14/2023)   Received from [facility]   Overall Financial Resource Strain (CARDIA)    Difficulty of Paying Living Expenses: Not very hard  Food Insecurity: No Food Insecurity (01/14/2023)   Received from [facility]   Hunger Vital Sign    Worried About Running Out of Food in the Last Year: Never true    Ran Out of Food in the Last Year: Never true  Transportation Needs: No Transportation Needs (01/14/2023)   Received from [facility] - Transportation    Lack of Transportation (Medical): No    Lack of Transportation (Non-Medical): No  Physical Activity: Insufficiently Active (09/30/2017)   Exercise Vital Sign    Days of Exercise per Week: 2 days    Minutes of Exercise per Session: 10 min  Stress: Not on file  Social Connections: Unknown (08/22/2021)   Received from [facility]   Social Network    Social Network: Not on file    Family History  Problem Relation Age of Onset   Cancer Mother 55       breast cancer   Cancer Father 14       skin cancer  Aneurysm Father    Colon cancer Neg Hx    Esophageal cancer Neg Hx    Rectal cancer Neg Hx    Stomach cancer Neg Hx     Health Maintenance  Topic Date Due   INFLUENZA VACCINE  07/12/2023 (Originally 11/12/2022)   COVID-19 Vaccine (7 - 2024-25 season) 05/28/2024 (Originally 12/13/2022)   Colonoscopy  08/14/2024   DTaP/Tdap/Td (4 - Td or Tdap) 01/18/2029   Hepatitis C Screening  Completed   HIV Screening  Completed   Zoster Vaccines- Shingrix  Completed   HPV VACCINES  Aged Out     ----------------------------------------------------------------------------------------------------------------------------------------------------------------------------------------------------------------- Physical Exam BP 131/79 (BP Location: Left Arm, Patient Position: Sitting, Cuff Size: Large)   Pulse 74   Ht 6\' 4"  (1.93 m)   Wt 213 lb (96.6 kg)   SpO2 98%   BMI 25.93 kg/m   Physical  Exam Constitutional:      Appearance: Normal appearance.  HENT:     Head: Normocephalic and atraumatic.  Eyes:     General: No scleral icterus. Cardiovascular:     Rate and Rhythm: Normal rate and regular rhythm.  Pulmonary:     Effort: Pulmonary effort is normal.     Breath sounds: Normal breath sounds.  Musculoskeletal:     Cervical back: Neck supple.  Neurological:     Mental Status: He is alert.  Psychiatric:        Mood and Affect: Mood normal.        Behavior: Behavior normal.     ------------------------------------------------------------------------------------------------------------------------------------------------------------------------------------------------------------------- Assessment and Plan  Cough Prolonged cough x3-4 weeks. Adding azithromycin, prednisone burst and robitussin AC.  Red flags reviewed.    Meds ordered this encounter  Medications   dutasteride (AVODART) 0.5 MG capsule    Sig: Take 1 capsule (0.5 mg total) by mouth daily.    Dispense:  90 capsule    Refill:  2   metFORMIN (GLUCOPHAGE-XR) 500 MG 24 hr tablet    Sig: TAKE 1 TABLET BY MOUTH EVERY DAY WITH BREAKFAST    Dispense:  90 tablet    Refill:  3   minoxidil (LONITEN) 2.5 MG tablet    Sig: Take 1 tablet (2.5 mg total) by mouth daily.    Dispense:  90 tablet    Refill:  1   valACYclovir (VALTREX) 1000 MG tablet    Sig: Take 2 tablets (2,000 mg total) by mouth 2 (two) times daily. For coldsore at onset of symptoms    Dispense:  30 tablet    Refill:  2   zolpidem (AMBIEN) 5 MG tablet    Sig: Take 1 tablet (5 mg total) by mouth at bedtime as needed. for sleep    Dispense:  30 tablet    Refill:  3    This request is for a new prescription for a controlled substance as required by Federal/State law.   azithromycin (ZITHROMAX) 250 MG tablet    Sig: Take 2 tablets on day 1, then 1 tablet daily on days 2 through 5    Dispense:  6 tablet    Refill:  0   predniSONE (DELTASONE) 20  MG tablet    Sig: Take 1 tablet (20 mg total) by mouth 2 (two) times daily with a meal for 5 days.    Dispense:  10 tablet    Refill:  0   guaiFENesin-codeine 100-10 MG/5ML syrup    Sig: Take 5 mLs by mouth every 6 (six) hours as needed for cough.  Dispense:  145 mL    Refill:  0    No follow-ups on file.    This visit occurred during the SARS-CoV-2 public health emergency.  Safety protocols were in place, including screening questions prior to the visit, additional usage of staff PPE, and extensive cleaning of exam room while observing appropriate contact time as indicated for disinfecting solutions.

## 2023-05-13 NOTE — Assessment & Plan Note (Signed)
Prolonged cough x3-4 weeks. Adding azithromycin, prednisone burst and robitussin AC.  Red flags reviewed.

## 2023-06-07 ENCOUNTER — Encounter: Payer: Self-pay | Admitting: Family Medicine

## 2023-11-07 ENCOUNTER — Other Ambulatory Visit: Payer: Self-pay | Admitting: Family Medicine

## 2023-11-17 ENCOUNTER — Other Ambulatory Visit: Payer: Self-pay

## 2023-11-17 DIAGNOSIS — F5102 Adjustment insomnia: Secondary | ICD-10-CM

## 2023-11-17 MED ORDER — TADALAFIL 20 MG PO TABS: ORAL_TABLET | ORAL | 1 refills | Status: DC

## 2023-11-17 MED ORDER — ZOLPIDEM TARTRATE 5 MG PO TABS: 5.0000 mg | ORAL_TABLET | Freq: Every evening | ORAL | 3 refills | Status: DC | PRN

## 2023-11-22 ENCOUNTER — Other Ambulatory Visit: Payer: Self-pay | Admitting: Family Medicine

## 2023-11-22 DIAGNOSIS — F5102 Adjustment insomnia: Secondary | ICD-10-CM

## 2023-11-22 NOTE — Telephone Encounter (Signed)
 Copied from CRM [id number]. Topic: Clinical - Medication Refill >> Nov 22, 2023  4:30 PM Merlynn A wrote: Medication: tadalafil (CIALIS) 20 MG tablet, zolpidem (AMBIEN) 5 MG tablet   Has the patient contacted their pharmacy? No (Agent: If no, request that the patient contact the pharmacy for the refill. If patient does not wish to contact the pharmacy document the reason why and proceed with request.) (Agent: If yes, when and what did the pharmacy advise?)  Pharmacy called to request refills for patient.   This is the patient's preferred pharmacy:  [id number] - [place], [place] - [address] AT [facility] [address] [place] [address] Phone: [phone] Fax: [phone]  Is this the correct pharmacy for this prescription? Yes If no, delete pharmacy and type the correct one.   Has the prescription been filled recently? No  Is the patient out of the medication? Yes  Has the patient been seen for an appointment in the last year OR does the patient have an upcoming appointment? Yes  Can we respond through MyChart? Yes  Agent: Please be advised that Rx refills may take up to 3 business days. We ask that you follow-up with your pharmacy.

## 2024-01-05 ENCOUNTER — Encounter: Payer: Self-pay | Admitting: Family Medicine

## 2024-01-05 ENCOUNTER — Ambulatory Visit: Admitting: Family Medicine

## 2024-01-05 VITALS — BP 125/85 | HR 69 | Ht 75.0 in | Wt 225.9 lb

## 2024-01-05 DIAGNOSIS — R7303 Prediabetes: Secondary | ICD-10-CM | POA: Diagnosis not present

## 2024-01-05 DIAGNOSIS — R079 Chest pain, unspecified: Secondary | ICD-10-CM

## 2024-01-05 DIAGNOSIS — G629 Polyneuropathy, unspecified: Secondary | ICD-10-CM

## 2024-01-05 DIAGNOSIS — R635 Abnormal weight gain: Secondary | ICD-10-CM

## 2024-01-05 DIAGNOSIS — N401 Enlarged prostate with lower urinary tract symptoms: Secondary | ICD-10-CM

## 2024-01-05 NOTE — Progress Notes (Signed)
 Darrell Contreras - 63 y.o. male MRN [id number]  Date of birth: 04-Dec-1960  Subjective Chief Complaint  Patient presents with   Chest Pain    HPI Darrell Contreras is a 63 y.o. male here today for hospital follow-up.  He was seen in the ED with complaint of chest pain.  He reports that he was in [place]  for his son's wedding which caused him to be a little more stressed than his typical baseline stress.  History of extensive stress related to his previous marriage and custody of his children.  This has been detailed in previous notes.  He was scheduled to see a cardiologist in [facility] but had to put this off due to his son's wedding.  Denies dyspnea associated with chest pain.  Rule out for ACS in the emergency room and had unremarkable nuclear stress test.  CT angio negative for PE.  She is continue to have some intermittent episodes of chest pain.  He does report some abnormal weight gain over the past several months.  He does feel like he stays pretty active.  He does tend to stress eat at times.  He is interested in trying GLP-1 but is not sure if this will cover these.  We like to have testosterone  levels checked.  ROS:  A comprehensive ROS was completed and negative except as noted per HPI  Allergies  Allergen Reactions   Latex Other (See Comments)    BAND-AID, ADHESIVE, CAUSE  SKIN WHELPS    Past Medical History:  Diagnosis Date   Abnormal ECG during exercise stress test 11/09/2016   Abnormal stress ECG    a. 08/2016: stress test showing 2 mm horizontal ST depression in the inferior and lateral leads with occasional PVC's, overall findings suggestive of ischemia but possible repol abnormality.   Prostatitis    Rosacea    Seizures (HCC)    states had seizure when IV was put in 15 years ago- pt states this isn't accurate    Past Surgical History:  Procedure Laterality Date   COLONOSCOPY     NASAL SEPTUM SURGERY     WISDOM TOOTH EXTRACTION      Social History   Socioeconomic  History   Marital status: Divorced    Spouse name: Not on file   Number of children: 5   Years of education: Not on file   Highest education level: Not on file  Occupational History    Comment: Software  Tobacco Use   Smoking status: Never   Smokeless tobacco: Never  Vaping Use   Vaping status: Never Used  Substance and Sexual Activity   Alcohol use: Yes    Alcohol/week: 0.0 standard drinks of alcohol    Comment: occasional   Drug use: No   Sexual activity: Not on file  Other Topics Concern   Not on file  Social History Narrative   Not on file   Social Drivers of Health   Financial Resource Strain: Low Risk  (01/14/2023)   Received from [facility]   Overall Financial Resource Strain (CARDIA)    Difficulty of Paying Living Expenses: Not very hard  Food Insecurity: No Food Insecurity (01/14/2023)   Received from [facility]   Hunger Vital Sign    Within the past 12 months, you worried that your food would run out before you got the money to buy more.: Never true    Within the past 12 months, the food you bought just didn't last and you didn't have money  to get more.: Never true  Transportation Needs: No Transportation Needs (01/14/2023)   Received from [facility]   PRAPARE - Transportation    Lack of Transportation (Medical): No    Lack of Transportation (Non-Medical): No  Physical Activity: Insufficiently Active (09/30/2017)   Exercise Vital Sign    Days of Exercise per Week: 2 days    Minutes of Exercise per Session: 10 min  Stress: Not on file  Social Connections: Unknown (08/22/2021)   Received from [facility]   Social Network    Social Network: Not on file    Family History  Problem Relation Age of Onset   Cancer Mother 31       breast cancer   Cancer Father 55       skin cancer   Aneurysm Father    Colon cancer Neg Hx    Esophageal cancer Neg Hx    Rectal cancer Neg Hx    Stomach cancer Neg Hx     Health Maintenance  Topic Date Due    Pneumococcal Vaccine: 50+ Years (1 of 1 - PCV) Never done   Influenza Vaccine  11/12/2023   COVID-19 Vaccine (8 - 2025-26 season) 12/13/2023   Colonoscopy  08/14/2024   DTaP/Tdap/Td (4 - Td or Tdap) 01/18/2029   Hepatitis C Screening  Completed   HIV Screening  Completed   Zoster Vaccines- Shingrix  Completed   Hepatitis B Vaccines 19-59 Average Risk  Aged Out   HPV VACCINES  Aged Out   Meningococcal B Vaccine  Aged Out     ----------------------------------------------------------------------------------------------------------------------------------------------------------------------------------------------------------------- Physical Exam BP 125/85 (BP Location: Left Arm, Patient Position: Sitting, Cuff Size: Large)   Pulse 69   Ht 6' 3 (1.905 m)   Wt 225 lb 14.4 oz (102.5 kg)   SpO2 99%   BMI 28.24 kg/m   Physical Exam Constitutional:      Appearance: Normal appearance. He is well-developed.  Eyes:     General: No scleral icterus. Cardiovascular:     Rate and Rhythm: Normal rate and regular rhythm.  Pulmonary:     Effort: Pulmonary effort is normal.     Breath sounds: Normal breath sounds.  Neurological:     General: No focal deficit present.     Mental Status: He is alert.  Psychiatric:        Mood and Affect: Mood normal.        Behavior: Behavior normal.     ------------------------------------------------------------------------------------------------------------------------------------------------------------------------------------------------------------------- Assessment and Plan  Prediabetes Currently doing well with metformin  at current strength.  Will plan to continue.  Encouraged continued dietary changes.  BPH (benign prostatic hyperplasia) Continues on dutasteride  to help with BPH symptoms as well as hair loss.  Chest pain Normal stress test recently.  Ruled out for PE.  Likely related to anxiety.  Abnormal weight gain Encouraged continued  dietary change.  He would like to have testosterone  levels checked.   No orders of the defined types were placed in this encounter.   Return in about 4 months (around 05/06/2024) for Hypertension.

## 2024-01-07 LAB — CMP14+EGFR
ALT: 25 IU/L (ref 0–44)
AST: 30 IU/L (ref 0–40)
Albumin: 4.7 g/dL (ref 3.9–4.9)
Alkaline Phosphatase: 79 IU/L (ref 47–123)
BUN/Creatinine Ratio: 12 (ref 10–24)
BUN: 13 mg/dL (ref 8–27)
Bilirubin Total: 0.6 mg/dL (ref 0.0–1.2)
CO2: 23 mmol/L (ref 20–29)
Calcium: 10.6 mg/dL — ABNORMAL HIGH (ref 8.6–10.2)
Chloride: 101 mmol/L (ref 96–106)
Creatinine, Ser: 1.13 mg/dL (ref 0.76–1.27)
Globulin, Total: 2.6 g/dL (ref 1.5–4.5)
Glucose: 88 mg/dL (ref 70–99)
Potassium: 4.9 mmol/L (ref 3.5–5.2)
Sodium: 139 mmol/L (ref 134–144)
Total Protein: 7.3 g/dL (ref 6.0–8.5)
eGFR: 73 mL/min/1.73 (ref >59)

## 2024-01-07 LAB — CBC WITH DIFFERENTIAL/PLATELET
Basophils Absolute: 0.1 x10E3/uL (ref 0.0–0.2)
Basos: 1 %
EOS (ABSOLUTE): 0.3 x10E3/uL (ref 0.0–0.4)
Eos: 4 %
Hematocrit: 45.3 % (ref 37.5–51.0)
Hemoglobin: 15.1 g/dL (ref 13.0–17.7)
Immature Grans (Abs): 0 x10E3/uL (ref 0.0–0.1)
Immature Granulocytes: 0 %
Lymphocytes Absolute: 2 x10E3/uL (ref 0.7–3.1)
Lymphs: 32 %
MCH: 30.2 pg (ref 26.6–33.0)
MCHC: 33.3 g/dL (ref 31.5–35.7)
MCV: 91 fL (ref 79–97)
Monocytes Absolute: 0.7 x10E3/uL (ref 0.1–0.9)
Monocytes: 11 %
Neutrophils Absolute: 3.1 x10E3/uL (ref 1.4–7.0)
Neutrophils: 52 %
Platelets: 219 x10E3/uL (ref 150–450)
RBC: 5 x10E6/uL (ref 4.14–5.80)
RDW: 13 % (ref 11.6–15.4)
WBC: 6.1 x10E3/uL (ref 3.4–10.8)

## 2024-01-07 LAB — HEMOGLOBIN A1C
Est. average glucose Bld gHb Est-mCnc: 126 mg/dL
Hgb A1c MFr Bld: 6 % — ABNORMAL HIGH (ref 4.8–5.6)

## 2024-01-07 LAB — TESTOSTERONE: Testosterone: 917 ng/dL — ABNORMAL HIGH (ref 264–916)

## 2024-01-09 DIAGNOSIS — R079 Chest pain, unspecified: Secondary | ICD-10-CM | POA: Insufficient documentation

## 2024-01-09 DIAGNOSIS — R635 Abnormal weight gain: Secondary | ICD-10-CM | POA: Insufficient documentation

## 2024-01-09 NOTE — Assessment & Plan Note (Signed)
 Normal stress test recently.  Ruled out for PE.  Likely related to anxiety.

## 2024-01-09 NOTE — Assessment & Plan Note (Signed)
 Encouraged continued dietary change.  He would like to have testosterone  levels checked.

## 2024-01-09 NOTE — Assessment & Plan Note (Signed)
Continues on dutasteride to help with BPH symptoms as well as hair loss.

## 2024-01-09 NOTE — Assessment & Plan Note (Signed)
 Currently doing well with metformin  at current strength.  Will plan to continue.  Encouraged continued dietary changes.

## 2024-01-11 ENCOUNTER — Ambulatory Visit: Payer: Self-pay | Admitting: Family Medicine

## 2024-02-10 ENCOUNTER — Other Ambulatory Visit: Payer: Self-pay | Admitting: Family Medicine

## 2024-04-04 ENCOUNTER — Encounter: Payer: Self-pay | Admitting: Family Medicine

## 2024-04-04 ENCOUNTER — Ambulatory Visit

## 2024-04-04 ENCOUNTER — Ambulatory Visit: Admitting: Family Medicine

## 2024-04-04 VITALS — BP 138/65 | HR 62 | Ht 75.0 in | Wt 222.0 lb

## 2024-04-04 DIAGNOSIS — Z125 Encounter for screening for malignant neoplasm of prostate: Secondary | ICD-10-CM

## 2024-04-04 DIAGNOSIS — R7303 Prediabetes: Secondary | ICD-10-CM | POA: Diagnosis not present

## 2024-04-04 DIAGNOSIS — Z0000 Encounter for general adult medical examination without abnormal findings: Secondary | ICD-10-CM | POA: Diagnosis not present

## 2024-04-04 DIAGNOSIS — R059 Cough, unspecified: Secondary | ICD-10-CM

## 2024-04-04 DIAGNOSIS — R053 Chronic cough: Secondary | ICD-10-CM | POA: Diagnosis not present

## 2024-04-04 DIAGNOSIS — F5102 Adjustment insomnia: Secondary | ICD-10-CM

## 2024-04-04 DIAGNOSIS — E782 Mixed hyperlipidemia: Secondary | ICD-10-CM

## 2024-04-04 DIAGNOSIS — G629 Polyneuropathy, unspecified: Secondary | ICD-10-CM

## 2024-04-04 MED ORDER — METFORMIN HCL ER 500 MG PO TB24: ORAL_TABLET | ORAL | 3 refills | Status: DC

## 2024-04-04 MED ORDER — ZOLPIDEM TARTRATE 5 MG PO TABS: 5.0000 mg | ORAL_TABLET | Freq: Every evening | ORAL | 3 refills | Status: AC | PRN

## 2024-04-04 MED ORDER — ESCITALOPRAM OXALATE 5 MG PO TABS: 5.0000 mg | ORAL_TABLET | Freq: Every day | ORAL | 0 refills | Status: AC

## 2024-04-04 MED ORDER — TADALAFIL 20 MG PO TABS: ORAL_TABLET | ORAL | 1 refills | Status: AC

## 2024-04-04 MED ORDER — VALACYCLOVIR HCL 1 G PO TABS: 2000.0000 mg | ORAL_TABLET | Freq: Two times a day (BID) | ORAL | 2 refills | Status: AC

## 2024-04-04 MED ORDER — MINOXIDIL 2.5 MG PO TABS: 2.5000 mg | ORAL_TABLET | Freq: Every day | ORAL | 1 refills | Status: DC

## 2024-04-04 NOTE — Assessment & Plan Note (Signed)
CXR ordered today

## 2024-04-04 NOTE — Assessment & Plan Note (Signed)
 Well adult Orders Placed This Encounter  Procedures   DG Chest 2 View    Standing Status:   Future    Expiration Date:   04/04/2025    Reason for Exam (SYMPTOM  OR DIAGNOSIS REQUIRED):   cough    Preferred imaging location?:   [facility]   CMP14+EGFR   CBC with Differential/Platelet   Lipid Panel With LDL/HDL Ratio   PSA   TSH   HgB A1c   Vitamin D  (25 hydroxy)   B12  Screening: UTD Immunizations: UTD Anticipatory guidance/Risk factor Reduction:  Recommendations per AVS.

## 2024-04-04 NOTE — Progress Notes (Signed)
 " Darrell Contreras - 63 y.o. male MRN [id number]  Date of birth: 08/16/1960  Subjective Chief Complaint  Patient presents with   Annual Exam    HPI Darrell Contreras is a 63 y.o. male here today for annual exam.   He reports that he is doing pretty well.   Reports that he is having cough for several weeks with intermittent chest pain.  No dyspnea or wheezing.  Reports that he had a friend that had a cough and had an MRI of his chest and lung cancer was found.  He is now concerned about this.  Denies weight loss or chest pain.   He is moderately active.  He feels that diet is pretty good.   He is a non-smoker.  He has cut back on EtOH use.   Review of Systems  Constitutional:  Negative for chills, fever, malaise/fatigue and weight loss.  HENT:  Negative for congestion, ear pain and sore throat.   Eyes:  Negative for blurred vision, double vision and pain.  Respiratory:  Negative for cough and shortness of breath.   Cardiovascular:  Negative for chest pain and palpitations.  Gastrointestinal:  Negative for abdominal pain, blood in stool, constipation, heartburn and nausea.  Genitourinary:  Negative for dysuria and urgency.  Musculoskeletal:  Negative for joint pain and myalgias.  Neurological:  Negative for dizziness and headaches.  Endo/Heme/Allergies:  Does not bruise/bleed easily.  Psychiatric/Behavioral:  Negative for depression. The patient is not nervous/anxious and does not have insomnia.     Allergies[1]  Past Medical History:  Diagnosis Date   Abnormal ECG during exercise stress test 11/09/2016   Abnormal stress ECG    a. 08/2016: stress test showing 2 mm horizontal ST depression in the inferior and lateral leads with occasional PVC's, overall findings suggestive of ischemia but possible repol abnormality.   Prostatitis    Rosacea    Seizures (HCC)    states had seizure when IV was put in 15 years ago- pt states this isn't accurate    Past Surgical History:  Procedure Laterality  Date   COLONOSCOPY     NASAL SEPTUM SURGERY     WISDOM TOOTH EXTRACTION      Social History   Socioeconomic History   Marital status: Divorced    Spouse name: Not on file   Number of children: 5   Years of education: Not on file   Highest education level: Not on file  Occupational History    Comment: Software  Tobacco Use   Smoking status: Never   Smokeless tobacco: Never  Vaping Use   Vaping status: Never Used  Substance and Sexual Activity   Alcohol use: Yes    Alcohol/week: 0.0 standard drinks of alcohol    Comment: occasional   Drug use: No   Sexual activity: Not on file  Other Topics Concern   Not on file  Social History Narrative   Not on file   Social Drivers of Health   Tobacco Use: Low Risk (04/04/2024)   Patient History    Smoking Tobacco Use: Never    Smokeless Tobacco Use: Never    Passive Exposure: Not on file  Financial Resource Strain: Low Risk (01/14/2023)   Received from [facility]   Overall Financial Resource Strain (CARDIA)    Difficulty of Paying Living Expenses: Not very hard  Food Insecurity: No Food Insecurity (01/14/2023)   Received from [facility]   Epic    Within the past 12 months, you worried  that your food would run out before you got the money to buy more.: Never true    Within the past 12 months, the food you bought just didn't last and you didn't have money to get more.: Never true  Transportation Needs: No Transportation Needs (01/14/2023)   Received from [facility]   PRAPARE - Transportation    Lack of Transportation (Medical): No    Lack of Transportation (Non-Medical): No  Physical Activity: Not on file  Stress: Not on file  Social Connections: Not on file  Depression (PHQ2-9): Low Risk (04/04/2024)   Depression (PHQ2-9)    PHQ-2 Score: 3  Alcohol Screen: Not on file  Housing: Not on file  Utilities: Not At Risk (01/14/2023)   Received from [facility] Utilities    Threatened with loss of utilities: No   Health Literacy: Not on file    Family History  Problem Relation Age of Onset   Cancer Mother 46       breast cancer   Cancer Father 18       skin cancer   Aneurysm Father    Colon cancer Neg Hx    Esophageal cancer Neg Hx    Rectal cancer Neg Hx    Stomach cancer Neg Hx     Health Maintenance  Topic Date Due   Pneumococcal Vaccine: 50+ Years (1 of 1 - PCV) Never done   COVID-19 Vaccine (7 - 2025-26 season) 12/13/2023   Influenza Vaccine  07/11/2024 (Originally 11/12/2023)   Colonoscopy  08/14/2024   DTaP/Tdap/Td (4 - Td or Tdap) 01/18/2029   Hepatitis C Screening  Completed   HIV Screening  Completed   Zoster Vaccines- Shingrix  Completed   Hepatitis B Vaccines 19-59 Average Risk  Aged Out   HPV VACCINES  Aged Out   Meningococcal B Vaccine  Aged Out     ----------------------------------------------------------------------------------------------------------------------------------------------------------------------------------------------------------------- Physical Exam BP 138/65 (BP Location: Left Arm, Patient Position: Sitting, Cuff Size: Normal)   Pulse 62   Ht 6' 3 (1.905 m)   Wt 222 lb (100.7 kg)   SpO2 98%   BMI 27.75 kg/m   Physical Exam Constitutional:      General: He is not in acute distress. HENT:     Head: Normocephalic and atraumatic.     Right Ear: Tympanic membrane and external ear normal.     Left Ear: Tympanic membrane and external ear normal.  Eyes:     General: No scleral icterus. Neck:     Thyroid: No thyromegaly.  Cardiovascular:     Rate and Rhythm: Normal rate and regular rhythm.     Heart sounds: Normal heart sounds.  Pulmonary:     Effort: Pulmonary effort is normal.     Breath sounds: Normal breath sounds.  Abdominal:     General: Bowel sounds are normal. There is no distension.     Palpations: Abdomen is soft.     Tenderness: There is no abdominal tenderness. There is no guarding.  Musculoskeletal:     Cervical back:  Normal range of motion.  Lymphadenopathy:     Cervical: No cervical adenopathy.  Skin:    General: Skin is warm and dry.     Findings: No rash.  Neurological:     Mental Status: He is alert and oriented to person, place, and time.     Cranial Nerves: No cranial nerve deficit.     Motor: No abnormal muscle tone.  Psychiatric:  Mood and Affect: Mood normal.        Behavior: Behavior normal.     ------------------------------------------------------------------------------------------------------------------------------------------------------------------------------------------------------------------- Assessment and Plan  Well adult exam Well adult Orders Placed This Encounter  Procedures   DG Chest 2 View    Standing Status:   Future    Expiration Date:   04/04/2025    Reason for Exam (SYMPTOM  OR DIAGNOSIS REQUIRED):   cough    [facility]    CMP14+EGFR   CBC with Differential/Platelet   Lipid Panel With LDL/HDL Ratio   PSA   TSH   HgB A1c   Vitamin D  (25 hydroxy)   B12  Screening: UTD Immunizations: UTD Anticipatory guidance/Risk factor Reduction:  Recommendations per AVS.   Cough CXR ordered today.    Neuropathy Recheck A1c and b12.    Meds ordered this encounter  Medications   zolpidem  (AMBIEN ) 5 MG tablet    Sig: Take 1 tablet (5 mg total) by mouth at bedtime as needed. for sleep    Dispense:  30 tablet    Refill:  3    This request is for a new prescription for a controlled substance as required by Federal/State law.   valACYclovir  (VALTREX ) 1000 MG tablet    Sig: Take 2 tablets (2,000 mg total) by mouth 2 (two) times daily. For coldsore at onset of symptoms    Dispense:  30 tablet    Refill:  2   tadalafil  (CIALIS ) 20 MG tablet    Sig: TAKE 1/2 TO 1 TABLET (10-20MG  TOTAL) BY MOUTH EVERY OTHER DAY AS NEEDED FOR ERECTILE DYSFUNCTION    Dispense:  45 tablet    Refill:  1   minoxidil  (LONITEN ) 2.5 MG  tablet    Sig: Take 1 tablet (2.5 mg total) by mouth daily.    Dispense:  90 tablet    Refill:  1   metFORMIN  (GLUCOPHAGE -XR) 500 MG 24 hr tablet    Sig: TAKE 1 TABLET BY MOUTH EVERY DAY WITH BREAKFAST    Dispense:  90 tablet    Refill:  3   escitalopram  (LEXAPRO ) 5 MG tablet    Sig: Take 1 tablet (5 mg total) by mouth daily.    Dispense:  90 tablet    Refill:  0    No follow-ups on file.        [1]  Allergies Allergen Reactions   Latex Other (See Comments)    BAND-AID, ADHESIVE, CAUSE  SKIN WHELPS   "

## 2024-04-04 NOTE — Assessment & Plan Note (Signed)
 Recheck A1c and b12.

## 2024-04-04 NOTE — Patient Instructions (Signed)

## 2024-04-05 LAB — CMP14+EGFR
ALT: 26 IU/L (ref 0–44)
AST: 30 IU/L (ref 0–40)
Albumin: 4.8 g/dL (ref 3.9–4.9)
Alkaline Phosphatase: 69 IU/L (ref 47–123)
BUN/Creatinine Ratio: 15 (ref 10–24)
BUN: 13 mg/dL (ref 8–27)
Bilirubin Total: 1 mg/dL (ref 0.0–1.2)
CO2: 23 mmol/L (ref 20–29)
Calcium: 10 mg/dL (ref 8.6–10.2)
Chloride: 101 mmol/L (ref 96–106)
Creatinine, Ser: 0.86 mg/dL (ref 0.76–1.27)
Globulin, Total: 2.5 g/dL (ref 1.5–4.5)
Glucose: 90 mg/dL (ref 70–99)
Potassium: 4.5 mmol/L (ref 3.5–5.2)
Sodium: 139 mmol/L (ref 134–144)
Total Protein: 7.3 g/dL (ref 6.0–8.5)
eGFR: 97 mL/min/1.73

## 2024-04-05 LAB — CBC WITH DIFFERENTIAL/PLATELET
Basophils Absolute: 0.1 x10E3/uL (ref 0.0–0.2)
Basos: 1 %
EOS (ABSOLUTE): 0.1 x10E3/uL (ref 0.0–0.4)
Eos: 2 %
Hematocrit: 45.3 % (ref 37.5–51.0)
Hemoglobin: 15 g/dL (ref 13.0–17.7)
Immature Grans (Abs): 0 x10E3/uL (ref 0.0–0.1)
Immature Granulocytes: 0 %
Lymphocytes Absolute: 2.1 x10E3/uL (ref 0.7–3.1)
Lymphs: 35 %
MCH: 29.8 pg (ref 26.6–33.0)
MCHC: 33.1 g/dL (ref 31.5–35.7)
MCV: 90 fL (ref 79–97)
Monocytes Absolute: 0.6 x10E3/uL (ref 0.1–0.9)
Monocytes: 11 %
Neutrophils Absolute: 3.1 x10E3/uL (ref 1.4–7.0)
Neutrophils: 50 %
Platelets: 246 x10E3/uL (ref 150–450)
RBC: 5.04 x10E6/uL (ref 4.14–5.80)
RDW: 12.9 % (ref 11.6–15.4)
WBC: 6.1 x10E3/uL (ref 3.4–10.8)

## 2024-04-05 LAB — VITAMIN D 25 HYDROXY (VIT D DEFICIENCY, FRACTURES): Vit D, 25-Hydroxy: 37.1 ng/mL (ref 30.0–100.0)

## 2024-04-05 LAB — VITAMIN B12: Vitamin B-12: 593 pg/mL (ref 232–1245)

## 2024-04-05 LAB — HEMOGLOBIN A1C
Est. average glucose Bld gHb Est-mCnc: 123 mg/dL
Hgb A1c MFr Bld: 5.9 % — ABNORMAL HIGH (ref 4.8–5.6)

## 2024-04-05 LAB — LIPID PANEL WITH LDL/HDL RATIO
Cholesterol, Total: 189 mg/dL (ref 100–199)
HDL: 54 mg/dL
LDL Chol Calc (NIH): 110 mg/dL — ABNORMAL HIGH (ref 0–99)
LDL/HDL Ratio: 2 ratio (ref 0.0–3.6)
Triglycerides: 144 mg/dL (ref 0–149)
VLDL Cholesterol Cal: 25 mg/dL (ref 5–40)

## 2024-04-05 LAB — PSA: Prostate Specific Ag, Serum: <0.1 ng/mL (ref 0.0–4.0)

## 2024-04-05 LAB — TSH: TSH: 1.29 u[IU]/mL (ref 0.450–4.500)

## 2024-04-06 ENCOUNTER — Ambulatory Visit: Payer: Self-pay | Admitting: Family Medicine

## 2024-05-08 ENCOUNTER — Other Ambulatory Visit: Payer: Self-pay | Admitting: Family Medicine

## 2024-05-09 ENCOUNTER — Other Ambulatory Visit: Payer: Self-pay | Admitting: Family Medicine
